# Patient Record
Sex: Male | Born: 1985 | Hispanic: No | Marital: Married | Smoking: Former smoker
Health system: Southern US, Community
[De-identification: ages and names within clinical notes are randomized; demographics above are authoritative.]

## PROBLEM LIST (undated history)

## (undated) DIAGNOSIS — A150 Tuberculosis of lung: Secondary | ICD-10-CM

## (undated) DIAGNOSIS — T7840XA Allergy, unspecified, initial encounter: Secondary | ICD-10-CM

## (undated) DIAGNOSIS — E559 Vitamin D deficiency, unspecified: Secondary | ICD-10-CM

## (undated) DIAGNOSIS — K219 Gastro-esophageal reflux disease without esophagitis: Secondary | ICD-10-CM

## (undated) DIAGNOSIS — G009 Bacterial meningitis, unspecified: Secondary | ICD-10-CM

## (undated) HISTORY — PX: OTHER SURGICAL HISTORY: SHX169

## (undated) HISTORY — DX: Tuberculosis of lung: A15.0

## (undated) HISTORY — DX: Vitamin D deficiency, unspecified: E55.9

## (undated) HISTORY — DX: Bacterial meningitis, unspecified: G00.9

## (undated) HISTORY — DX: Gastro-esophageal reflux disease without esophagitis: K21.9

## (undated) HISTORY — DX: Allergy, unspecified, initial encounter: T78.40XA

---
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
## 2008-09-02 ENCOUNTER — Emergency Department: Payer: Self-pay | Admitting: Unknown Physician Specialty

## 2014-04-14 ENCOUNTER — Ambulatory Visit: Payer: Self-pay | Admitting: Physician Assistant

## 2014-04-14 LAB — RAPID STREP-A WITH REFLX: Micro Text Report: NEGATIVE

## 2014-04-17 LAB — BETA STREP CULTURE(ARMC)

## 2016-11-14 DIAGNOSIS — A150 Tuberculosis of lung: Secondary | ICD-10-CM

## 2016-11-14 HISTORY — DX: Tuberculosis of lung: A15.0

## 2017-02-01 DIAGNOSIS — H52223 Regular astigmatism, bilateral: Secondary | ICD-10-CM | POA: Diagnosis not present

## 2017-02-01 DIAGNOSIS — H5213 Myopia, bilateral: Secondary | ICD-10-CM | POA: Diagnosis not present

## 2018-03-28 ENCOUNTER — Ambulatory Visit
Admission: EM | Admit: 2018-03-28 | Discharge: 2018-03-28 | Disposition: A | Discharge status: Home / Self Care | Payer: BC Managed Care – PPO | Attending: Family Medicine | Admitting: Family Medicine

## 2018-03-28 DIAGNOSIS — K122 Cellulitis and abscess of mouth: Secondary | ICD-10-CM | POA: Diagnosis not present

## 2018-03-28 DIAGNOSIS — J029 Acute pharyngitis, unspecified: Secondary | ICD-10-CM

## 2018-03-28 LAB — RAPID STREP SCREEN (MED CTR MEBANE ONLY): Streptococcus, Group A Screen (Direct): NEGATIVE

## 2018-03-28 MED ORDER — PREDNISONE 20 MG PO TABS: 40.0000 mg | ORAL_TABLET | Freq: Every day | ORAL | 0 refills | Status: AC

## 2018-03-28 NOTE — ED Provider Notes (Signed)
[facility] ____________________________________________  Time seen: Approximately 7:53 PM  I have reviewed the triage vital signs and the nursing notes.   HISTORY  Chief Complaint Sore Throat  HPI Casey Stevens is a 32 y.o. male presenting for evaluation of right sided throat irritation for the last several weeks.  States overall present for 3 weeks, but states present is not constant.  States no sore throat or pain.  States only an irritation to the right side of his throat.  States swallowing may slightly help.  Denies injury or trauma.  Denies difficulty swallowing, tongue swelling, facial swelling, shortness of breath, chest pain.  Reports he has had a history of similar several years ago with his uvula being swollen but that it went away shortly after being seen without medication intervention.  States the irritation had gone away after a few days, but returned today. Denies patterns, and denies food patterns. Patient states right irritation was irritated more last week after drinking alcohol but then went away after not drinking alcohol.  States does not drink alcohol every day and this is not a trigger.  States all initially started around the same time that he was having seasonal allergies with accompanying runny nose, nasal congestion.  States does snore a lot and states that his wife his previous questioned sleep apnea, but denies any apneic periods.  Denies any fevers, current cough, congestion, abdominal pain, sick contacts.  Reports otherwise feels well. Denies acid reflux symptoms.   Denies chest pain, shortness of breath, abdominal pain, dysuria, extremity pain, extremity swelling or rash. Denies recent sickness. Denies recent antibiotic use.    No past medical history on file. Denies There are no active problems to display for this patient.   The histories are not reviewed yet. Please review them in the "History" navigator section and refresh this  SmartLink.   No current facility-administered medications for this encounter.   Current Outpatient Medications:  .  predniSONE (DELTASONE) 20 MG tablet, Take 2 tablets (40 mg total) by mouth daily for 3 days., Disp: 6 tablet, Rfl: 0  Allergies Patient has no known allergies.  No family history on file.  Social History Social History   Tobacco Use  . Smoking status: Not on file  Substance Use Topics  . Alcohol use: Not on file  . Drug use: Not on file    Review of Systems Constitutional: No fever/chills ENT: No sore throat.  As above. Cardiovascular: Denies chest pain. Respiratory: Denies shortness of breath. Gastrointestinal: No abdominal pain.  No nausea, no vomiting.  No diarrhea.   Musculoskeletal: Negative for back pain. Skin: Negative for rash. ____________________________________________   PHYSICAL EXAM:  VITAL SIGNS: ED Triage Vitals  Enc Vitals Group     BP 03/28/18 1905 117/87     Pulse Rate 03/28/18 1905 77     Resp 03/28/18 1905 16     Temp 03/28/18 1905 99 F (37.2 C)     Temp Source 03/28/18 1905 Oral     SpO2 03/28/18 1905 98 %     Weight 03/28/18 1902 185 lb (83.9 kg)     Height 03/28/18 1902  (1.727 m)     Head Circumference --      Peak Flow --      Pain Score 03/28/18 1902 0     Pain Loc --      Pain Edu? --      Excl. in GC? --    Constitutional: Alert and oriented.  Well appearing and in no acute distress. Eyes: Conjunctivae are normal. Head: Atraumatic. No sinus tenderness to palpation. No swelling. No erythema.  Ears: no erythema, normal TMs bilaterally.   Nose:No nasal congestion   Mouth/Throat: Mucous membranes are moist. No pharyngeal erythema. No tonsillar swelling or exudate.  Mild uvular swelling, minimal erythema, no uvular deviation.  No oral lesions noted. Neck: No stridor.  No cervical spine tenderness to palpation. Hematological/Lymphatic/Immunilogical: No cervical lymphadenopathy. Cardiovascular: Normal rate, regular  rhythm. Grossly normal heart sounds.  Good peripheral circulation. Respiratory: Normal respiratory effort.  No retractions. No wheezes, rales or rhonchi. Good air movement.  Gastrointestinal: Soft and nontender. No CVA tenderness. Musculoskeletal: Ambulatory with steady gait. No cervical, thoracic or lumbar tenderness to palpation. Neurologic:  Normal speech and language. No gait instability. Skin:  Skin appears warm, dry and intact. No rash noted. Psychiatric: Mood and affect are normal. Speech and behavior are normal. ___________________________________________   LABS (all labs ordered are listed, but only abnormal results are displayed)  Labs Reviewed  RAPID STREP SCREEN ([facility] ONLY)  CULTURE, GROUP A STREP [facility])   ____________________________________________   PROCEDURES Procedures    INITIAL IMPRESSION / ASSESSMENT AND PLAN / ED COURSE  Pertinent labs & imaging results that were available during my care of the patient were reviewed by me and considered in my medical decision making (see chart for details).  Well-appearing patient.  No acute distress.  Presenting for evaluation of right-sided throat irritation that has been intermittently present for the last 3 weeks.  No accompanying fevers or other complaints at this time.  Reports initially onset was with seasonal allergy symptoms.  Patient does have noted mild uvulitis at this time.  Strep negative, will culture.  Suspect may be related to allergic symptoms as well as also discussed monitoring for sleep apnea.  Recommend for restart daily antihistamine and will treat with 3 days of prednisone.  Discussed strict follow-up and return parameters.Discussed indication, risks and benefits of medications with patient.  Discussed follow up with Primary care physician this week. Discussed follow up and return parameters including no resolution or any worsening concerns. Patient verbalized understanding and agreed to plan.    ____________________________________________   FINAL CLINICAL IMPRESSION(S) / ED DIAGNOSES  Final diagnoses:  Pharyngitis, unspecified etiology  Uvulitis     ED Discharge Orders        Ordered    predniSONE (DELTASONE) 20 MG tablet  Daily     03/28/18 2008       Note: This dictation was prepared with Dragon dictation along with smaller phrase technology. Any transcriptional errors that result from this process are unintentional.         Renford Dills, NP 03/28/18 2038

## 2018-03-28 NOTE — Discharge Instructions (Addendum)
Take medication as prescribed. Rest. Drink plenty of fluids.  Take over-the-counter antihistamine as discussed.  Follow up with your primary care physician or the above, this week as needed. Return to Urgent care as needed.  For any shortness of breath, difficulty swallowing or worsening concerns proceed directly to emergency room.

## 2018-03-28 NOTE — ED Triage Notes (Signed)
C/O "irritation" to right side of throat for over week. No other symptoms reported

## 2018-03-31 LAB — CULTURE, GROUP A STREP (THRC)

## 2018-04-05 ENCOUNTER — Ambulatory Visit (INDEPENDENT_AMBULATORY_CARE_PROVIDER_SITE_OTHER): Payer: BC Managed Care – PPO | Admitting: Family Medicine

## 2018-04-05 ENCOUNTER — Encounter: Payer: Self-pay | Admitting: Family Medicine

## 2018-04-05 VITALS — BP 114/64 | HR 91 | Temp 98.3°F | Resp 14 | Ht 67.0 in | Wt 202.7 lb

## 2018-04-05 DIAGNOSIS — E66811 Obesity, class 1: Secondary | ICD-10-CM

## 2018-04-05 DIAGNOSIS — R5383 Other fatigue: Secondary | ICD-10-CM | POA: Diagnosis not present

## 2018-04-05 DIAGNOSIS — Z0000 Encounter for general adult medical examination without abnormal findings: Secondary | ICD-10-CM | POA: Diagnosis not present

## 2018-04-05 DIAGNOSIS — Z789 Other specified health status: Secondary | ICD-10-CM | POA: Diagnosis not present

## 2018-04-05 DIAGNOSIS — J301 Allergic rhinitis due to pollen: Secondary | ICD-10-CM | POA: Diagnosis not present

## 2018-04-05 DIAGNOSIS — E669 Obesity, unspecified: Secondary | ICD-10-CM

## 2018-04-05 DIAGNOSIS — R7611 Nonspecific reaction to tuberculin skin test without active tuberculosis: Secondary | ICD-10-CM | POA: Diagnosis not present

## 2018-04-05 DIAGNOSIS — D509 Iron deficiency anemia, unspecified: Secondary | ICD-10-CM

## 2018-04-05 NOTE — Assessment & Plan Note (Signed)
Continue the daily allergy medicine

## 2018-04-05 NOTE — Assessment & Plan Note (Signed)
Check TSH; encourage modest weight loss

## 2018-04-05 NOTE — Progress Notes (Signed)
BP 114/64   Pulse 91   Temp 98.3 F (36.8 C) (Oral)   Resp 14   Ht  (1.702 m)   Wt 202 lb 11.2 oz (91.9 kg)   SpO2 95%   BMI 31.75 kg/m    Subjective:    Patient ID: Casey Stevens, male    DOB: 19-Nov-1985, 32 y.o.   MRN: [id number]  HPI: Casey Stevens is a 32 y.o. male  Chief Complaint  Patient presents with  . New Patient (Initial Visit)  . Fatigue    no energy    HPI Patient is here to establish care with me here at this practice; former patient of mine at previous practice; last visit was June 06, 2014 C/o fatigue Waking up in the middle of the night with son Busy Iron might be low; hx of low iron, had to take prescription for a while Not sure if runs in the family No blood in urine or stool He had changed to vegetarian diet, but not eating as healthy as it sounds; not as well balanced as it should be Works 10 hour days Just started vitamins about 3 weeks ago; bright yellow urine, [employer] As the weather has changed, he noticed a really bad allergy season; he has developed allergies; noticed two episodes of shortness of breath, not sure if reactive airway; none since; felt like he had to take some deep breaths; no ankle swelling; no wheezing; there is asthma in the family, mother has albuterol when needed, some type of bronchial spasm that she developed, has SABA if needed; these happened when he came out of the building and right before he got in the car when all the pollen was out Weight gain noted, about 10 pounds over the last year; the weight from earlier this month NOT measured 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 Allergy   . Bacterial  meningitis 1999  . GERD (gastroesophageal reflux disease)   . TB (pulmonary tuberculosis)    Past Surgical History:  Procedure Laterality Date  . bacterial meningitis     Family History  Problem Relation Age of Onset  . Hypertension Mother   . Heart attack Maternal Grandmother   . Asthma Maternal Grandmother   . Heart failure Paternal Grandmother    Social History   Tobacco Use  . Smoking status: Former Games developer  . Smokeless tobacco: Never Used  Substance Use Topics  . Alcohol use: Yes    Comment: ocassional  . Drug use: Never    Interim medical history since last visit reviewed. Allergies and medications reviewed  Review of Systems Per HPI unless specifically indicated above     Objective:    BP 114/64   Pulse 91   Temp 98.3 F (36.8 C) (Oral)   Resp 14   Ht  (1.702 m)   Wt 202 lb 11.2 oz (91.9 kg)   SpO2 95%   BMI 31.75 kg/m   Wt Readings from Last 3 Encounters:  04/05/18 202 lb 11.2 oz (91.9 kg)  03/28/18 185 lb (83.9 kg)   MD note: the weight from 03/28/18 was NOT measured, just an estimation  Physical Exam  Constitutional: He appears well-developed and well-nourished. No distress.  obese  HENT:  Head: Normocephalic and atraumatic.  Eyes: EOM are normal. No scleral icterus.  Neck: No thyromegaly present.  Cardiovascular: Normal rate and regular rhythm.  Pulmonary/Chest: Effort normal and breath sounds normal.  Abdominal: Soft. Bowel sounds are normal. He exhibits no distension.  Musculoskeletal: He exhibits no edema.  Neurological: He is alert. He has normal strength. Coordination normal.  Skin: Skin is warm and dry. No pallor.  No nailbed pallor  Psychiatric: He has a normal mood and affect. His behavior is normal. Judgment and thought content normal.    Results for orders placed or performed during the hospital encounter of 03/28/18  Rapid Strep Screen ([facility] ONLY)  Result Value Ref Range   Streptococcus, Group A Screen (Direct)  NEGATIVE NEGATIVE  Culture, group A strep  Result Value Ref Range   Specimen Description      THROAT Performed at [facility], [address], [place], [place] [address]    Special Requests      NONE Reflexed from [id number] Performed at [facility], [address], [place], [place] [address]    Culture      NO GROUP A STREP (S.PYOGENES) ISOLATED Performed at [facility], [address] [place], [place] [address]    Report Status 03/31/2018 FINAL       Assessment & Plan:   Problem List Items Addressed This Visit      Respiratory   Seasonal allergic rhinitis due to pollen (Chronic)    Continue the daily allergy medicine        Other   Positive TB test    Advised patient that he needs to follow-up with the health department; that is not something that I manage here; he verbalized understanding      Obesity (BMI 30.0-34.9) (Chronic)    Check TSH; encourage modest weight loss       Other Visit Diagnoses    Other fatigue    -  Primary   Relevant Orders   VITAMIN D 25 Hydroxy (Vit-D Deficiency, Fractures)   Iron deficiency anemia, unspecified iron deficiency anemia type       Relevant Orders   Fe+TIBC+Fer   Vegetarian diet       Relevant Medications   Omega-3 Fatty Acids (FISH OIL) 1000 MG CAPS   Other Relevant Orders   Vitamin B12   Encounter for preventive care       Relevant Orders   CBC with Differential/Platelet   COMPLETE METABOLIC PANEL WITH GFR   Lipid panel   TSH       Follow up plan: No follow-ups on file.  An after-visit summary was printed and given to the patient at check-out.  Please see the patient instructions which may contain other information and recommendations beyond what is mentioned above in the assessment and plan.  No orders of the defined types were placed in this encounter.   Orders Placed This Encounter  Procedures  . Vitamin B12  . VITAMIN D 25 Hydroxy (Vit-D Deficiency, Fractures)  . CBC  with Differential/Platelet  . COMPLETE METABOLIC PANEL WITH GFR  . Lipid panel  . TSH  . Fe+TIBC+Fer

## 2018-04-05 NOTE — Patient Instructions (Signed)
We'll check labs today If you have not heard anything from my staff in a week about any orders/referrals/studies from today, please contact us here to follow-up [phone] Consider quinoa for a complete protein  Check out the information at familydoctor.org entitled "Nutrition for Weight Loss: What You Need to Know about Fad Diets" Try to lose between 1-2 pounds per week by taking in fewer calories and burning off more calories You can succeed by limiting portions, limiting foods dense in calories and fat, becoming more active, and drinking 8 glasses of water a day (64 ounces) Don't skip meals, especially breakfast, as skipping meals may alter your metabolism Do not use over-the-counter weight loss pills or gimmicks that claim rapid weight loss A healthy BMI (or body mass index) is between 18.5 and 24.9 You can calculate your ideal BMI at the [employer] website [employer]

## 2018-04-05 NOTE — Assessment & Plan Note (Signed)
Advised patient that he needs to follow-up with the health department; that is not something that I manage here; he verbalized understanding

## 2018-04-06 ENCOUNTER — Encounter: Payer: Self-pay | Admitting: Family Medicine

## 2018-04-06 ENCOUNTER — Other Ambulatory Visit: Payer: Self-pay | Admitting: Family Medicine

## 2018-04-06 DIAGNOSIS — R0602 Shortness of breath: Secondary | ICD-10-CM

## 2018-04-06 LAB — IRON,TIBC AND FERRITIN PANEL
%SAT: 28 % (calc) (ref 15–60)
FERRITIN: 83 ng/mL (ref 20–345)
Iron: 95 ug/dL (ref 50–180)
TIBC: 336 mcg/dL (calc) (ref 250–425)

## 2018-04-06 LAB — CBC WITH DIFFERENTIAL/PLATELET
BASOS PCT: 1.3 %
Basophils Absolute: 72 cells/uL (ref 0–200)
EOS ABS: 132 {cells}/uL (ref 15–500)
EOS PCT: 2.4 %
HCT: 44.7 % (ref 38.5–50.0)
Hemoglobin: 14.9 g/dL (ref 13.2–17.1)
Lymphs Abs: 1711 cells/uL (ref 850–3900)
MCH: 27.2 pg (ref 27.0–33.0)
MCHC: 33.3 g/dL (ref 32.0–36.0)
MCV: 81.6 fL (ref 80.0–100.0)
MONOS PCT: 9.6 %
MPV: 9.4 fL (ref 7.5–12.5)
Neutro Abs: 3058 cells/uL (ref 1500–7800)
Neutrophils Relative %: 55.6 %
PLATELETS: 355 10*3/uL (ref 140–400)
RBC: 5.48 10*6/uL (ref 4.20–5.80)
RDW: 12.8 % (ref 11.0–15.0)
TOTAL LYMPHOCYTE: 31.1 %
WBC mixed population: 528 cells/uL (ref 200–950)
WBC: 5.5 10*3/uL (ref 3.8–10.8)

## 2018-04-06 LAB — LIPID PANEL
CHOLESTEROL: 173 mg/dL (ref <200)
HDL: 59 mg/dL (ref >40)
LDL CHOLESTEROL (CALC): 95 mg/dL
Non-HDL Cholesterol (Calc): 114 mg/dL (calc) (ref <130)
Total CHOL/HDL Ratio: 2.9 (calc) (ref <5.0)
Triglycerides: 93 mg/dL (ref <150)

## 2018-04-06 LAB — TSH: TSH: 0.98 mIU/L (ref 0.40–4.50)

## 2018-04-06 LAB — COMPLETE METABOLIC PANEL WITH GFR
AG Ratio: 1.4 (calc) (ref 1.0–2.5)
ALT: 24 U/L (ref 9–46)
AST: 20 U/L (ref 10–40)
Albumin: 4.5 g/dL (ref 3.6–5.1)
Alkaline phosphatase (APISO): 53 U/L (ref 40–115)
BILIRUBIN TOTAL: 0.6 mg/dL (ref 0.2–1.2)
BUN/Creatinine Ratio: 7 (calc) (ref 6–22)
BUN: 6 mg/dL — AB (ref 7–25)
CHLORIDE: 100 mmol/L (ref 98–110)
CO2: 28 mmol/L (ref 20–32)
Calcium: 9.7 mg/dL (ref 8.6–10.3)
Creat: 0.92 mg/dL (ref 0.60–1.35)
GFR, Est African American: 128 mL/min/{1.73_m2} (ref >=60)
GFR, Est Non African American: 110 mL/min/{1.73_m2} (ref >=60)
GLUCOSE: 91 mg/dL (ref 65–139)
Globulin: 3.3 g/dL (calc) (ref 1.9–3.7)
Potassium: 4.1 mmol/L (ref 3.5–5.3)
Sodium: 138 mmol/L (ref 135–146)
TOTAL PROTEIN: 7.8 g/dL (ref 6.1–8.1)

## 2018-04-06 LAB — VITAMIN D 25 HYDROXY (VIT D DEFICIENCY, FRACTURES): Vit D, 25-Hydroxy: 20 ng/mL — ABNORMAL LOW (ref 30–100)

## 2018-04-06 LAB — VITAMIN B12: Vitamin B-12: 462 pg/mL (ref 200–1100)

## 2018-04-06 MED ORDER — VITAMIN D (ERGOCALCIFEROL) 1.25 MG (50000 UNIT) PO CAPS: 50000.0000 [IU] | ORAL_CAPSULE | ORAL | 0 refills | Status: AC

## 2018-04-06 NOTE — Progress Notes (Signed)
Vit D Rx weekly for 4 weeks, then 800 iu vit D3 daily

## 2018-04-11 ENCOUNTER — Ambulatory Visit
Admission: RE | Admit: 2018-04-11 | Discharge: 2018-04-11 | Disposition: A | Discharge status: Home / Self Care | Payer: BC Managed Care – PPO | Source: Ambulatory Visit | Attending: Family Medicine | Admitting: Family Medicine

## 2018-04-11 ENCOUNTER — Other Ambulatory Visit
Admission: RE | Admit: 2018-04-11 | Discharge: 2018-04-11 | Disposition: A | Discharge status: Home / Self Care | Payer: BC Managed Care – PPO | Source: Ambulatory Visit | Attending: Family Medicine | Admitting: Family Medicine

## 2018-04-11 DIAGNOSIS — R0602 Shortness of breath: Secondary | ICD-10-CM

## 2018-04-11 LAB — FIBRIN DERIVATIVES D-DIMER (ARMC ONLY): FIBRIN DERIVATIVES D-DIMER (ARMC): 189.71 ng{FEU}/mL (ref 0.00–499.00)

## 2018-05-15 ENCOUNTER — Encounter: Payer: Self-pay | Admitting: Family Medicine

## 2018-05-15 DIAGNOSIS — E559 Vitamin D deficiency, unspecified: Secondary | ICD-10-CM | POA: Insufficient documentation

## 2018-05-15 HISTORY — DX: Vitamin D deficiency, unspecified: E55.9

## 2018-06-04 ENCOUNTER — Encounter: Payer: Self-pay | Admitting: Family Medicine

## 2018-06-04 MED ORDER — PANTOPRAZOLE SODIUM 20 MG PO TBEC: 20.0000 mg | DELAYED_RELEASE_TABLET | Freq: Every day | ORAL | 1 refills | Status: DC

## 2019-10-29 ENCOUNTER — Encounter: Payer: Self-pay | Admitting: Family Medicine

## 2019-10-29 ENCOUNTER — Encounter: Payer: BC Managed Care – PPO | Admitting: Family Medicine

## 2019-11-26 ENCOUNTER — Other Ambulatory Visit: Payer: Self-pay

## 2019-11-26 ENCOUNTER — Ambulatory Visit (INDEPENDENT_AMBULATORY_CARE_PROVIDER_SITE_OTHER): Payer: BC Managed Care – PPO | Admitting: Family Medicine

## 2019-11-26 ENCOUNTER — Encounter: Payer: Self-pay | Admitting: Family Medicine

## 2019-11-26 VITALS — BP 116/80 | HR 92 | Temp 97.3°F | Resp 14 | Ht 68.0 in | Wt 205.8 lb

## 2019-11-26 DIAGNOSIS — Z130 Encounter for screening for diseases of the blood and blood-forming organs and certain disorders involving the immune mechanism: Secondary | ICD-10-CM

## 2019-11-26 DIAGNOSIS — Z13220 Encounter for screening for lipoid disorders: Secondary | ICD-10-CM | POA: Diagnosis not present

## 2019-11-26 DIAGNOSIS — Z0000 Encounter for general adult medical examination without abnormal findings: Secondary | ICD-10-CM | POA: Diagnosis not present

## 2019-11-26 DIAGNOSIS — E559 Vitamin D deficiency, unspecified: Secondary | ICD-10-CM

## 2019-11-26 DIAGNOSIS — Z13228 Encounter for screening for other metabolic disorders: Secondary | ICD-10-CM

## 2019-11-26 DIAGNOSIS — Z1329 Encounter for screening for other suspected endocrine disorder: Secondary | ICD-10-CM | POA: Diagnosis not present

## 2019-11-26 NOTE — Progress Notes (Signed)
Patient: Casey Stevens, Male    DOB: 12-Jun-1986, 34 y.o.   MRN: [id number] Danelle Berry, PA-C Visit Date: 11/26/2019  Today's Provider: Danelle Berry, PA-C   Chief Complaint  Patient presents with  . Annual Exam    would like a CBC done   Subjective:   Annual physical exam:  Casey Stevens is a 34 y.o. male who presents today for health maintenance and annual & complete physical exam.  He feels well.  He reports exercising a few times a week, started a few weeks ago Diet no meat in diet, eats fish occasionally, no fried food He reports he is sleeping well.  Has Vit D deficiency - will recheck labs today   USPSTF grade A and B recommendations - reviewed and addressed today  Depression:  Phq 9 completed today by patient, was reviewed by me with patient in the room, score is  negative, pt feels good PHQ 2/9 Scores 11/26/2019 04/05/2018  PHQ - 2 Score 0 0  PHQ- 9 Score 0 -   Depression screen [facility] 2/9 11/26/2019 04/05/2018  Decreased Interest 0 0  Down, Depressed, Hopeless 0 0  PHQ - 2 Score 0 0  Altered sleeping 0 -  Tired, decreased energy 0 -  Change in appetite 0 -  Feeling bad or failure about yourself  0 -  Trouble concentrating 0 -  Moving slowly or fidgety/restless 0 -  Suicidal thoughts 0 -  PHQ-9 Score 0 -  Difficult doing work/chores Not difficult at all -    Hep C Screening: n/a STD testing and prevention (HIV/chl/gon/syphilis): none needed Prostate cancer: n/a No results found for: PSA Intimate partner violence:  Feels safe  Advanced Care Planning:  A voluntary discussion about advance care planning including the explanation and discussion of advance directives.  Discussed health care proxy and Living will, and the patient was able to identify a health care proxy as his wife.  Patient does not have a living will at present time. If patient does have living will, I have requested they bring this to the clinic to be scanned in to their chart.   Skin  cancer:   Pt reports no hx of skin cancer, suspicious lesions/biopsies in the past.  Colorectal cancer:  colonoscopy is n/a  Lung cancer:   Low Dose CT Chest recommended if Age 19-80 years, 30 pack-year currently smoking OR have quit w/in 15years. Patient does not qualify.   Social History   Tobacco Use  . Smoking status: Former Games developer  . Smokeless tobacco: Never Used  Substance Use Topics  . Alcohol use: Yes    Comment: ocassional     Alcohol screening:   Office Visit from 11/26/2019 in [facility]  AUDIT-C Score  2      AAA:  The USPSTF recommends one-time screening with ultrasonography in men ages 5 to 68 years who have ever smoked  ECG: none today  Blood pressure/Hypertension: BP Readings from Last 3 Encounters:  11/26/19 116/80  04/05/18 114/64  03/28/18 117/87   Weight/Obesity: Wt Readings from Last 3 Encounters:  11/26/19 205 lb 12.8 oz (93.4 kg)  04/05/18 202 lb 11.2 oz (91.9 kg)  03/28/18 185 lb (83.9 kg)   BMI Readings from Last 3 Encounters:  11/26/19 31.29 kg/m  04/05/18 31.75 kg/m  03/28/18 28.13 kg/m    Lipids:  Lab Results  Component Value Date   CHOL 173 04/05/2018   Lab Results  Component Value Date  HDL 59 04/05/2018   Lab Results  Component Value Date   LDLCALC 95 04/05/2018   Lab Results  Component Value Date   TRIG 93 04/05/2018   Lab Results  Component Value Date   CHOLHDL 2.9 04/05/2018   No results found for: LDLDIRECT Based on the results of lipid panel his/her cardiovascular risk factor ( using [facility] )  in the next 10 years is : The ASCVD Risk score Denman George(Goff DC Jr., et al., 2013) failed to calculate for the following reasons:   The 2013 ASCVD risk score is only valid for ages 2540 to 6979 Glucose:  Glucose, Bld  Date Value Ref Range Status  04/05/2018 91 65 - 139 mg/dL Final    Comment:    .        Non-fasting reference interval .     Social History      He  reports that he has quit  smoking. He has never used smokeless tobacco. He reports current alcohol use. He reports that he does not use drugs.       Social History   Socioeconomic History  . Marital status: Married    Spouse name: Not on file  . Number of children: Not on file  . Years of education: Not on file  . Highest education level: Not on file  Occupational History  . Not on file  Tobacco Use  . Smoking status: Former Games developermoker  . Smokeless tobacco: Never Used  Substance and Sexual Activity  . Alcohol use: Yes    Comment: ocassional  . Drug use: Never  . Sexual activity: Yes  Other Topics Concern  . Not on file  Social History Narrative  . Not on file   Social Determinants of Health   Financial Resource Strain:   . Difficulty of Paying Living Expenses: Not on file  Food Insecurity:   . Worried About Programme researcher, broadcasting/film/videounning Out of Food in the Last Year: Not on file  . Ran Out of Food in the Last Year: Not on file  Transportation Needs:   . Lack of Transportation (Medical): Not on file  . Lack of Transportation (Non-Medical): Not on file  Physical Activity:   . Days of Exercise per Week: Not on file  . Minutes of Exercise per Session: Not on file  Stress:   . Feeling of Stress : Not on file  Social Connections:   . Frequency of Communication with Friends and Family: Not on file  . Frequency of Social Gatherings with Friends and Family: Not on file  . Attends Religious Services: Not on file  . Active Member of Clubs or Organizations: Not on file  . Attends BankerClub or Organization Meetings: Not on file  . Marital Status: Not on file         Social History   Socioeconomic History  . Marital status: Married    Spouse name: Not on file  . Number of children: Not on file  . Years of education: Not on file  . Highest education level: Not on file  Occupational History  . Not on file  Tobacco Use  . Smoking status: Former Games developermoker  . Smokeless tobacco: Never Used  Substance and Sexual Activity  . Alcohol  use: Yes    Comment: ocassional  . Drug use: Never  . Sexual activity: Yes  Other Topics Concern  . Not on file  Social History Narrative  . Not on file   Social Determinants of Health   Financial  Resource Strain:   . Difficulty of Paying Living Expenses: Not on file  Food Insecurity:   . Worried About Programme researcher, broadcasting/film/video in the Last Year: Not on file  . Ran Out of Food in the Last Year: Not on file  Transportation Needs:   . Lack of Transportation (Medical): Not on file  . Lack of Transportation (Non-Medical): Not on file  Physical Activity:   . Days of Exercise per Week: Not on file  . Minutes of Exercise per Session: Not on file  Stress:   . Feeling of Stress : Not on file  Social Connections:   . Frequency of Communication with Friends and Family: Not on file  . Frequency of Social Gatherings with Friends and Family: Not on file  . Attends Religious Services: Not on file  . Active Member of Clubs or Organizations: Not on file  . Attends Banker Meetings: Not on file  . Marital Status: Not on file  Intimate Partner Violence:   . Fear of Current or Ex-Partner: Not on file  . Emotionally Abused: Not on file  . Physically Abused: Not on file  . Sexually Abused: Not on file    Family History        Family Status  Relation Name Status  . Mother  Alive  . Father  Alive  . Sister 1 Alive  . Brother  Alive  . Son 1 Alive  . MGM  Deceased  . MGF  Deceased       black lung  . PGM  Alive  . PGF  Deceased       natural causes  . Brother  Alive        His family history includes Asthma in his maternal grandmother; Heart attack in his maternal grandmother; Heart failure in his paternal grandmother; Hypertension in his mother.       Family History  Problem Relation Age of Onset  . Hypertension Mother   . Heart attack Maternal Grandmother   . Asthma Maternal Grandmother   . Heart failure Paternal Grandmother     Patient Active Problem List   Diagnosis  Date Noted  . Vitamin D deficiency 05/15/2018  . Seasonal allergic rhinitis due to pollen 04/05/2018  . Obesity (BMI 30.0-34.9) 04/05/2018  . 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 bacterial meningitis       Current Outpatient Medications:  .  loratadine (CLARITIN) 10 MG tablet, Take 10 mg by mouth daily., Disp: , Rfl:  .  Multiple Vitamin (MULTIVITAMIN) tablet, Take 1 tablet by mouth daily., Disp: , Rfl:  .  Omega-3 Fatty Acids (FISH OIL) 1000 MG CAPS, Take 1 capsule (1,000 mg total) by mouth daily. (actually 1500 mg), Disp: , Rfl:  .  pantoprazole (PROTONIX) 20 MG tablet, Take 1 tablet (20 mg total) by mouth daily. (Patient not taking: Reported on 11/26/2019), Disp: 30 tablet, Rfl: 1  No Known Allergies  Patient Care Team: Danelle Berry, PA-C as PCP - General (Family Medicine)  I personally reviewed active problem list, medication list, allergies, family history, social history, health maintenance, notes from last encounter, lab results, imaging with the patient/caregiver today.   Review of Systems  Constitutional: Negative.  Negative for activity change, appetite change, fatigue and unexpected weight change.  HENT: Negative.   Eyes: Negative.   Respiratory: Negative.  Negative for shortness of breath.   Cardiovascular: Negative.  Negative for chest pain, palpitations  and leg swelling.  Gastrointestinal: Negative.  Negative for abdominal pain and blood in stool.  Endocrine: Negative.   Genitourinary: Negative.  Negative for decreased urine volume, difficulty urinating, testicular pain and urgency.  Skin: Negative.  Negative for color change and pallor.  Allergic/Immunologic: Negative.   Neurological: Negative.  Negative for syncope, weakness, light-headedness and numbness.  Psychiatric/Behavioral: Negative.  Negative for confusion, dysphoric mood, self-injury and suicidal ideas. The patient is not nervous/anxious.   All other  systems reviewed and are negative.         Objective:   Vitals:  Vitals:   11/26/19 1301  BP: 116/80  Pulse: 92  Resp: 14  Temp: (!) 97.3 F (36.3 C)  TempSrc: Temporal  SpO2: 97%  Weight: 205 lb 12.8 oz (93.4 kg)  Height: 5\' 8"  (1.727 m)    Body mass index is 31.29 kg/m.  Physical Exam Constitutional:      General: He is not in acute distress.    Appearance: Normal appearance. He is well-developed. He is not ill-appearing or toxic-appearing.  HENT:     Head: Normocephalic and atraumatic.     Jaw: No trismus.     Right Ear: Tympanic membrane, ear canal and external ear normal.     Left Ear: Tympanic membrane, ear canal and external ear normal.     Nose: No mucosal edema or rhinorrhea.     Right Sinus: No maxillary sinus tenderness or frontal sinus tenderness.     Left Sinus: No maxillary sinus tenderness or frontal sinus tenderness.     Mouth/Throat:     Pharynx: Uvula midline. No oropharyngeal exudate, posterior oropharyngeal erythema or uvula swelling.  Eyes:     General: Lids are normal.     Conjunctiva/sclera: Conjunctivae normal.     Pupils: Pupils are equal, round, and reactive to light.  Neck:     Trachea: Trachea and phonation normal. No tracheal deviation.  Cardiovascular:     Rate and Rhythm: Regular rhythm.     Pulses: Normal pulses.          Radial pulses are 2+ on the right side and 2+ on the left side.       Posterior tibial pulses are 2+ on the right side and 2+ on the left side.     Heart sounds: Normal heart sounds. No murmur. No friction rub. No gallop.   Pulmonary:     Effort: Pulmonary effort is normal.     Breath sounds: Normal breath sounds. No wheezing, rhonchi or rales.  Abdominal:     General: Bowel sounds are normal. There is no distension.     Palpations: Abdomen is soft.     Tenderness: There is no abdominal tenderness. There is no guarding or rebound.  Musculoskeletal:        General: Normal range of motion.     Cervical back:  Normal range of motion and neck supple.  Skin:    General: Skin is warm and dry.     Capillary Refill: Capillary refill takes less than 2 seconds.     Findings: No rash.  Neurological:     Mental Status: He is alert and oriented to person, place, and time.     Gait: Gait normal.  Psychiatric:        Speech: Speech normal.        Behavior: Behavior normal.      No results found for this or any previous visit (from the past 2160 hour(s)).   PHQ2/9:  Depression screen [facility] 2/9 11/26/2019 04/05/2018  Decreased Interest 0 0  Down, Depressed, Hopeless 0 0  PHQ - 2 Score 0 0  Altered sleeping 0 -  Tired, decreased energy 0 -  Change in appetite 0 -  Feeling bad or failure about yourself  0 -  Trouble concentrating 0 -  Moving slowly or fidgety/restless 0 -  Suicidal thoughts 0 -  PHQ-9 Score 0 -  Difficult doing work/chores Not difficult at all -    Fall Risk: Fall Risk  11/26/2019 04/05/2018  Falls in the past year? 0 No  Number falls in past yr: 0 -  Injury with Fall? 0 -    Functional Status Survey: Is the patient deaf or have difficulty hearing?: No Does the patient have difficulty seeing, even when wearing glasses/contacts?: No Does the patient have difficulty concentrating, remembering, or making decisions?: No Does the patient have difficulty walking or climbing stairs?: No Does the patient have difficulty dressing or bathing?: No Does the patient have difficulty doing errands alone such as visiting a doctor's office or shopping?: No   Assessment & Plan:    CPE completed today   . Prostate cancer screening and PSA options (with potential risks and benefits of testing vs not testing) were discussed along with recent recs/guidelines, shared decision making and handout/information given to pt today  . USPSTF grade A and B recommendations reviewed with patient; age-appropriate recommendations, preventive care, screening tests, etc discussed and encouraged; healthy  living encouraged; see AVS for patient education given to patient  . Discussed importance of 150 minutes of physical activity weekly, AHA exercise recommendations given to pt in AVS/handout  . Discussed importance of healthy diet:  eating lean meats and proteins, avoiding trans fats and saturated fats, avoid simple sugars and excessive carbs in diet, eat 6 servings of fruit/vegetables daily and drink plenty of water and avoid sweet beverages.  DASH diet reviewed if pt has HTN  . Recommended pt to do annual eye exam and routine dental exams/cleanings  . Reviewed Health Maintenance: Health Maintenance  Topic Date Due  . HIV Screening  10/07/2001  . TETANUS/TDAP  11/14/2024  . INFLUENZA VACCINE  Completed   . Immunizations: Immunization History  Administered Date(s) Administered  . Influenza-Unspecified 09/07/2019  . PFIZER SARS-COV-2 Vaccination 11/19/2019     ICD-10-CM   1. Adult general medical exam  Z00.00 CBC w/ Diff    CMP w GFR    Lipid Panel    Vit D  2. Vitamin D deficiency  E55.9 Vit D   low Vit D, last was about a year ato and was 20, no dairy, no vit d supplement, on multivitamin, recheck today  3. Screening for endocrine, metabolic and immunity disorder  Z13.29 CBC w/ Diff   Z13.228 CMP w GFR   Z13.0 Lipid Panel  4. Screening for lipoid disorders  Z13.220 Lipid Panel  5. Screening for deficiency anemia  Z13.0 CBC w/ Diff      Danelle Berry, PA-C 11/26/19 1:13 PM  [facility]

## 2019-11-26 NOTE — Patient Instructions (Signed)
Preventive Care 19-34 Years Old, Male Preventive care refers to lifestyle choices and visits with your health care provider that can promote health and wellness. This includes:  A yearly physical exam. This is also called an annual well check.  Regular dental and eye exams.  Immunizations.  Screening for certain conditions.  Healthy lifestyle choices, such as eating a healthy diet, getting regular exercise, not using drugs or products that contain nicotine and tobacco, and limiting alcohol use. What can I expect for my preventive care visit? Physical exam Your health care provider will check:  Height and weight. These may be used to calculate body mass index (BMI), which is a measurement that tells if you are at a healthy weight.  Heart rate and blood pressure.  Your skin for abnormal spots. Counseling Your health care provider may ask you questions about:  Alcohol, tobacco, and drug use.  Emotional well-being.  Home and relationship well-being.  Sexual activity.  Eating habits.  Work and work Statistician. What immunizations do I need?  Influenza (flu) vaccine  This is recommended every year. Tetanus, diphtheria, and pertussis (Tdap) vaccine  You may need a Td booster every 10 years. Varicella (chickenpox) vaccine  You may need this vaccine if you have not already been vaccinated. Human papillomavirus (HPV) vaccine  If recommended by your health care provider, you may need three doses over 6 months. Measles, mumps, and rubella (MMR) vaccine  You may need at least one dose of MMR. You may also need a second dose. Meningococcal conjugate (MenACWY) vaccine  One dose is recommended if you are 45-76 years old and a Market researcher living in a residence hall, or if you have one of several medical conditions. You may also need additional booster doses. Pneumococcal conjugate (PCV13) vaccine  You may need this if you have certain conditions and were not  previously vaccinated. Pneumococcal polysaccharide (PPSV23) vaccine  You may need one or two doses if you smoke cigarettes or if you have certain conditions. Hepatitis A vaccine  You may need this if you have certain conditions or if you travel or work in places where you may be exposed to hepatitis A. Hepatitis B vaccine  You may need this if you have certain conditions or if you travel or work in places where you may be exposed to hepatitis B. Haemophilus influenzae type b (Hib) vaccine  You may need this if you have certain risk factors. You may receive vaccines as individual doses or as more than one vaccine together in one shot (combination vaccines). Talk with your health care provider about the risks and benefits of combination vaccines. What tests do I need? Blood tests  Lipid and cholesterol levels. These may be checked every 5 years starting at age 17.  Hepatitis C test.  Hepatitis B test. Screening   Diabetes screening. This is done by checking your blood sugar (glucose) after you have not eaten for a while (fasting).  Sexually transmitted disease (STD) testing. Talk with your health care provider about your test results, treatment options, and if necessary, the need for more tests. Follow these instructions at home: Eating and drinking   Eat a diet that includes fresh fruits and vegetables, whole grains, lean protein, and low-fat dairy products.  Take vitamin and mineral supplements as recommended by your health care provider.  Do not drink alcohol if your health care provider tells you not to drink.  If you drink alcohol: ? Limit how much you have to 0-2  drinks a day. ? Be aware of how much alcohol is in your drink. In the [place] one drink equals one 12 oz bottle of beer (355 mL), one 5 oz glass of wine (148 mL), or one 1 oz glass of hard liquor (44 mL). Lifestyle  Take daily care of your teeth and gums.  Stay active. Exercise for at least 30 minutes on 5 or  more days each week.  Do not use any products that contain nicotine or tobacco, such as cigarettes, e-cigarettes, and chewing tobacco. If you need help quitting, ask your health care provider.  If you are sexually active, practice safe sex. Use a condom or other form of protection to prevent STIs (sexually transmitted infections). What's next?  Go to your health care provider once a year for a well check visit.  Ask your health care provider how often you should have your eyes and teeth checked.  Stay up to date on all vaccines. This information is not intended to replace advice given to you by your health care provider. Make sure you discuss any questions you have with your health care provider. Document Revised: 10/25/2018 Document Reviewed: 10/25/2018 Elsevier Patient Education  2020 [employer].

## 2019-11-27 LAB — LIPID PANEL
Cholesterol: 152 mg/dL (ref <200)
HDL: 48 mg/dL (ref >=40)
LDL Cholesterol (Calc): 79 mg/dL (calc)
Non-HDL Cholesterol (Calc): 104 mg/dL (calc) (ref <130)
Total CHOL/HDL Ratio: 3.2 (calc) (ref <5.0)
Triglycerides: 153 mg/dL — ABNORMAL HIGH (ref <150)

## 2019-11-27 LAB — COMPLETE METABOLIC PANEL WITH GFR
AG Ratio: 1.4 (calc) (ref 1.0–2.5)
ALT: 27 U/L (ref 9–46)
AST: 20 U/L (ref 10–40)
Albumin: 4.5 g/dL (ref 3.6–5.1)
Alkaline phosphatase (APISO): 53 U/L (ref 36–130)
BUN: 12 mg/dL (ref 7–25)
CO2: 29 mmol/L (ref 20–32)
Calcium: 9.5 mg/dL (ref 8.6–10.3)
Chloride: 104 mmol/L (ref 98–110)
Creat: 0.99 mg/dL (ref 0.60–1.35)
GFR, Est African American: 116 mL/min/{1.73_m2} (ref >=60)
GFR, Est Non African American: 100 mL/min/{1.73_m2} (ref >=60)
Globulin: 3.2 g/dL (calc) (ref 1.9–3.7)
Glucose, Bld: 92 mg/dL (ref 65–99)
Potassium: 4.2 mmol/L (ref 3.5–5.3)
Sodium: 140 mmol/L (ref 135–146)
Total Bilirubin: 0.4 mg/dL (ref 0.2–1.2)
Total Protein: 7.7 g/dL (ref 6.1–8.1)

## 2019-11-27 LAB — CBC WITH DIFFERENTIAL/PLATELET
Absolute Monocytes: 470 cells/uL (ref 200–950)
Basophils Absolute: 50 cells/uL (ref 0–200)
Basophils Relative: 1 %
Eosinophils Absolute: 70 cells/uL (ref 15–500)
Eosinophils Relative: 1.4 %
HCT: 43.3 % (ref 38.5–50.0)
Hemoglobin: 14.4 g/dL (ref 13.2–17.1)
Lymphs Abs: 1515 cells/uL (ref 850–3900)
MCH: 27.6 pg (ref 27.0–33.0)
MCHC: 33.3 g/dL (ref 32.0–36.0)
MCV: 83.1 fL (ref 80.0–100.0)
MPV: 9.7 fL (ref 7.5–12.5)
Monocytes Relative: 9.4 %
Neutro Abs: 2895 cells/uL (ref 1500–7800)
Neutrophils Relative %: 57.9 %
Platelets: 304 10*3/uL (ref 140–400)
RBC: 5.21 10*6/uL (ref 4.20–5.80)
RDW: 12.1 % (ref 11.0–15.0)
Total Lymphocyte: 30.3 %
WBC: 5 10*3/uL (ref 3.8–10.8)

## 2019-11-27 LAB — VITAMIN D 25 HYDROXY (VIT D DEFICIENCY, FRACTURES): Vit D, 25-Hydroxy: 10 ng/mL — ABNORMAL LOW (ref 30–100)

## 2019-11-28 ENCOUNTER — Other Ambulatory Visit: Payer: Self-pay | Admitting: Family Medicine

## 2019-11-28 MED ORDER — VITAMIN D (ERGOCALCIFEROL) 1.25 MG (50000 UNIT) PO CAPS: 50000.0000 [IU] | ORAL_CAPSULE | ORAL | 0 refills | Status: DC

## 2020-02-09 IMAGING — CR DG CHEST 2V
2 series · 2 of 2 positions shown · non-contrast
Comparison: None.

CLINICAL DATA: Shortness of breath for 3 weeks.

EXAM:
CHEST - 2 VIEW

[chest pa]
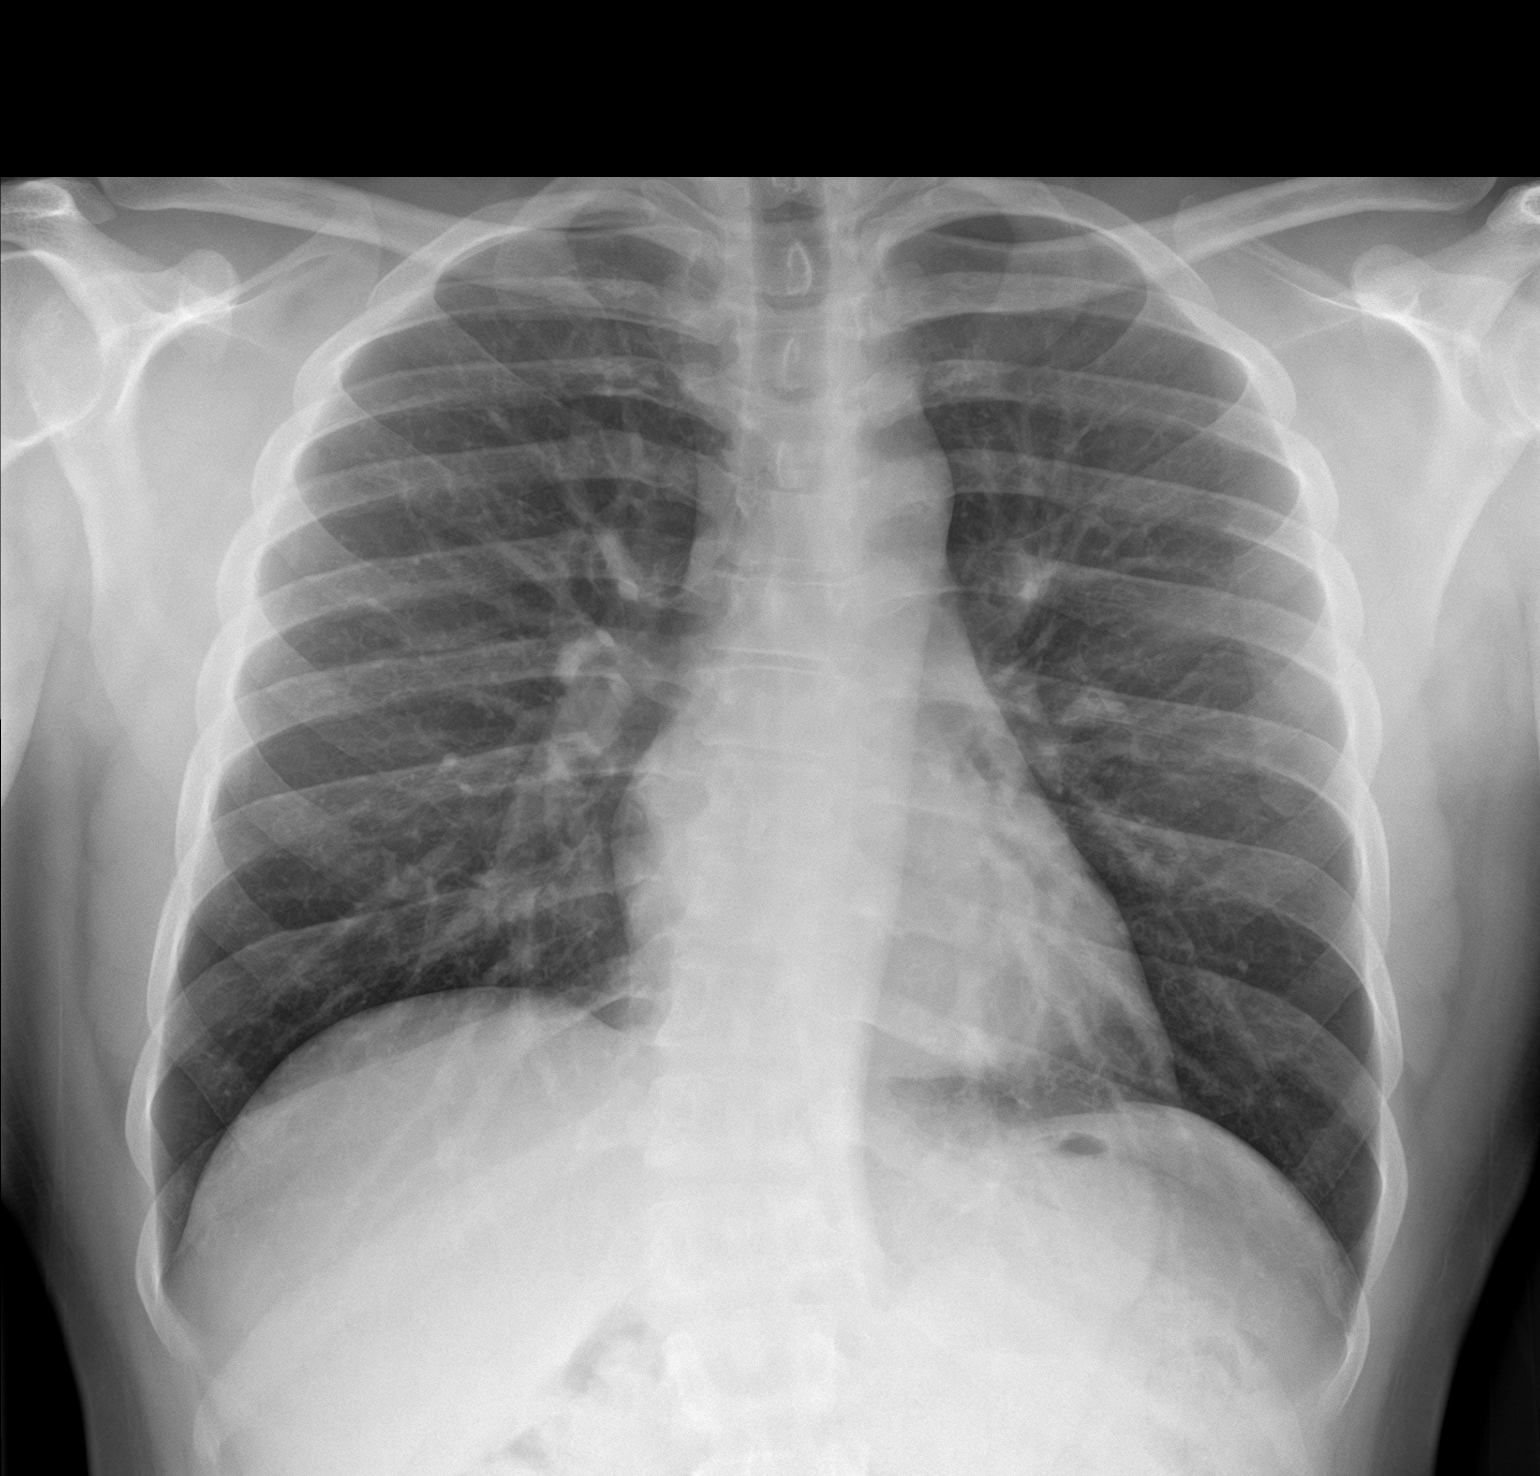

[chest lat]
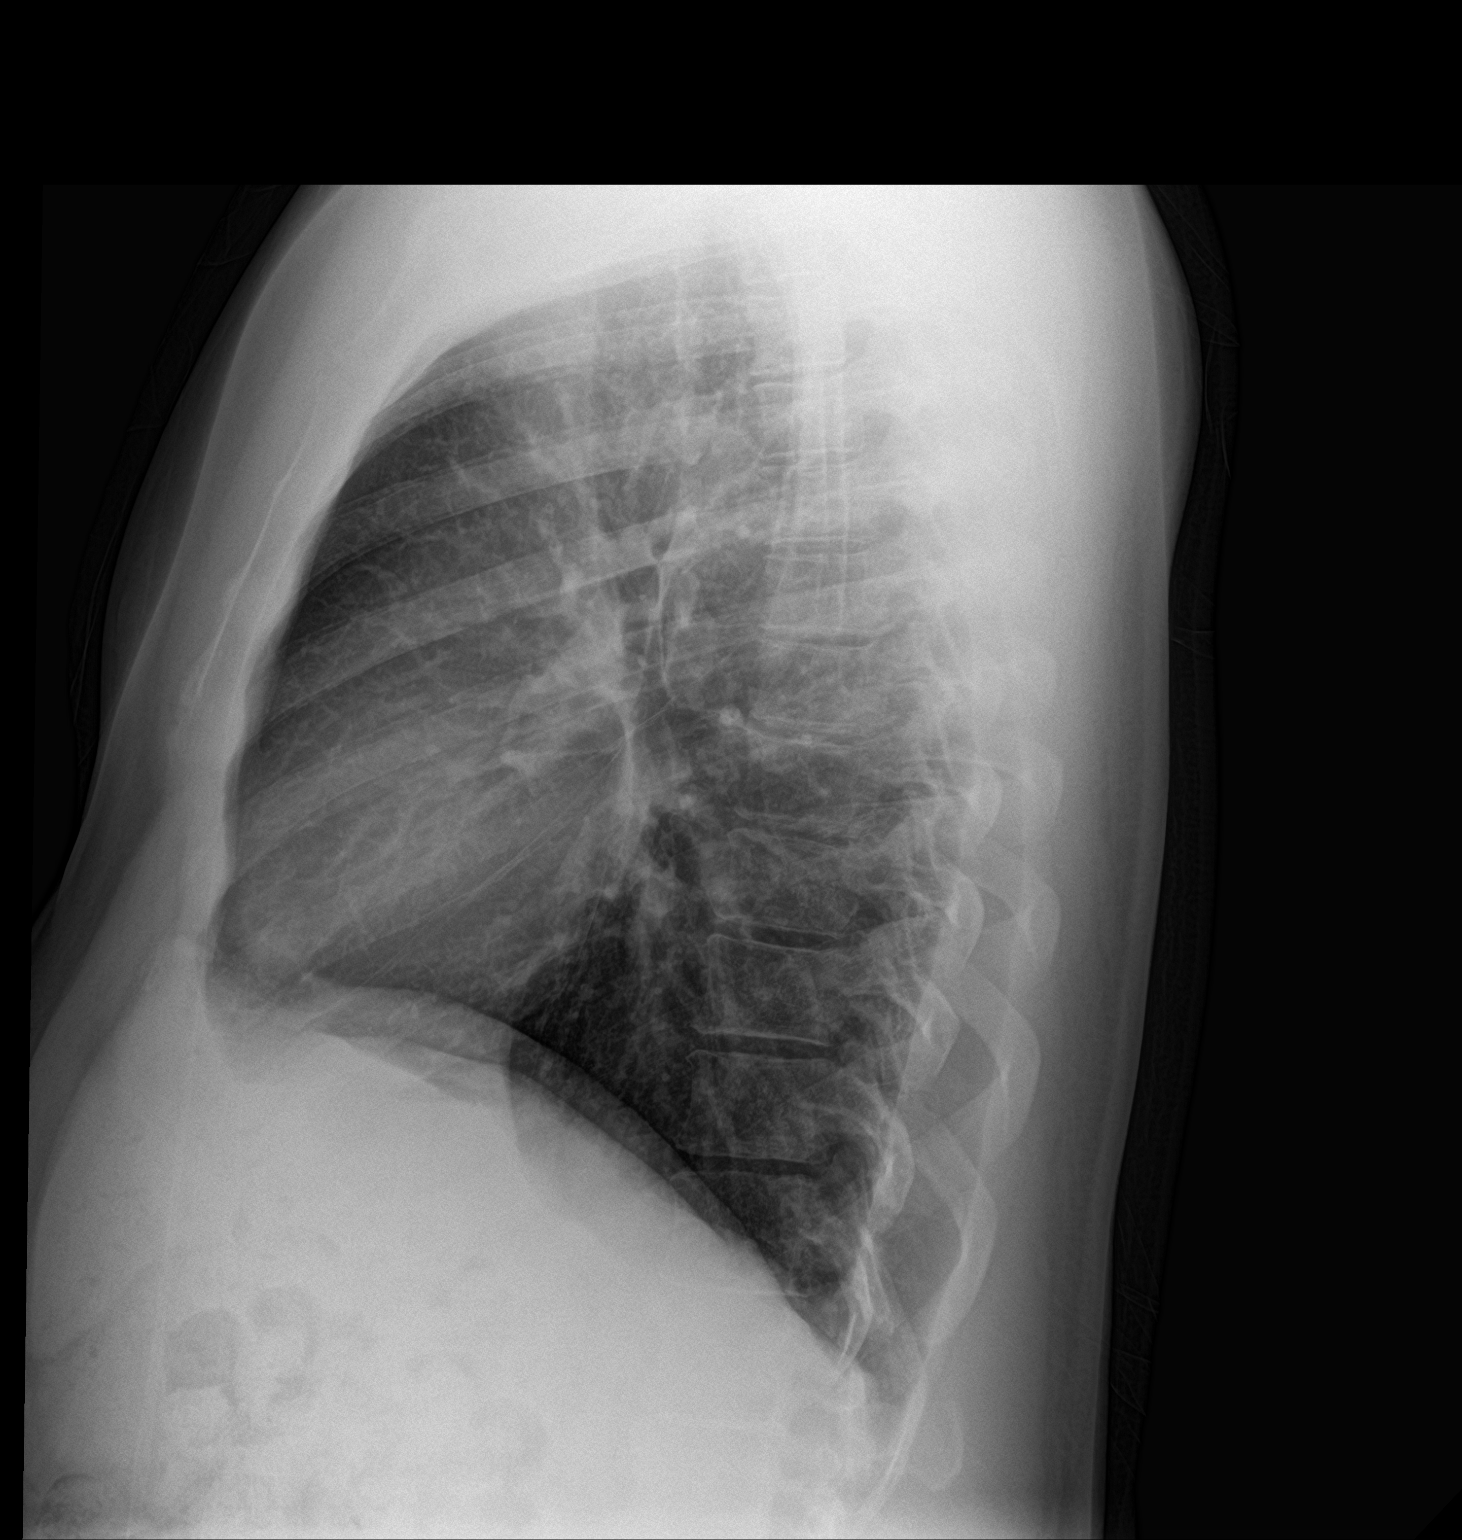

[2 of 2 positions shown; findings below may reference images not displayed]

FINDINGS: Heart and mediastinal contours are within normal limits. No focal
opacities or effusions. No acute bony abnormality.
IMPRESSION: No active cardiopulmonary disease.

## 2020-06-09 ENCOUNTER — Ambulatory Visit: Payer: BC Managed Care – PPO | Admitting: Internal Medicine

## 2020-06-09 ENCOUNTER — Other Ambulatory Visit: Payer: Self-pay

## 2020-06-09 ENCOUNTER — Encounter: Payer: Self-pay | Admitting: Internal Medicine

## 2020-06-09 VITALS — BP 110/78 | HR 93 | Temp 98.3°F | Resp 16 | Ht 68.0 in | Wt 195.4 lb

## 2020-06-09 DIAGNOSIS — S46012A Strain of muscle(s) and tendon(s) of the rotator cuff of left shoulder, initial encounter: Secondary | ICD-10-CM

## 2020-06-09 DIAGNOSIS — M25512 Pain in left shoulder: Secondary | ICD-10-CM | POA: Insufficient documentation

## 2020-06-09 NOTE — Patient Instructions (Signed)
Referral placed for orthopedics today

## 2020-06-09 NOTE — Progress Notes (Signed)
Patient ID: Casey Stevens, male    DOB: 26-Feb-1986, 34 y.o.   MRN: [id number]  PCP: Danelle Berry, PA-C  Chief Complaint  Patient presents with  . Shoulder Pain    left shoulder pain, he belives it is an injury from working out,  started in April, he says it has progressed since then    Subjective:   Casey Stevens is a 34 y.o. male, presents to clinic with CC of the following:  Chief Complaint  Patient presents with  . Shoulder Pain    left shoulder pain, he belives it is an injury from working out,  started in April, he says it has progressed since then    HPI:  Patient is a 34 year old male patient of Danelle Berry Presents today with left shoulder pain.  Patient with shoulder pain since April, was working out prior, and workouts did not include heavy weights, just more resistance bands and some pull-ups at times.  Had no one-time trauma could recall, just woke up 1 morning with shoulder pain and stiffness. Describes pain as intermittently sharp, not painful at rest presently, but certain movements cause increased pain, with the pain worse and less movement as needed to cause the pain in the more recent past.  Has been progressive. Pain located at the shoulder joint, not felt more in front or more behind, noted more on top. No h/o major shoulder injuries Not ice, not take any regular medicines to help in the more recent past. He noted after it first happened, and he tried conservative measures with rest, has not worked out since April, and symptoms have continued to progress. He denies any pains down the arm, no numbness or tingling in the upper extremity, not dropping things.  Also denies any upper back pains, infrascapular pain, and no neck pains. 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    Patient Active Problem List   Diagnosis Date Noted  . Vitamin D deficiency 05/15/2018  . Seasonal allergic rhinitis due to pollen 04/05/2018  .  Obesity (BMI 30.0-34.9) 04/05/2018  . Positive TB test 04/05/2018      Current Outpatient Medications:  .  loratadine (CLARITIN) 10 MG tablet, Take 10 mg by mouth daily., Disp: , Rfl:  .  Multiple Vitamin (MULTIVITAMIN) tablet, Take 1 tablet by mouth daily., Disp: , Rfl:  .  Omega-3 Fatty Acids (FISH OIL) 1000 MG CAPS, Take 1 capsule (1,000 mg total) by mouth daily. (actually 1500 mg), Disp: , Rfl:  .  famotidine (PEPCID) 10 MG tablet, Take by mouth., Disp: , Rfl:    No Known Allergies   Past Surgical History:  Procedure Laterality Date  . bacterial meningitis       Family History  Problem Relation Age of Onset  . Hypertension Mother   . Heart attack Maternal Grandmother   . Asthma Maternal Grandmother   . Heart failure Paternal Grandmother      Social History   Tobacco Use  . Smoking status: Former Games developer  . Smokeless tobacco: Never Used  Substance Use Topics  . Alcohol use: Yes    Comment: ocassional    With staff assistance, above reviewed with the patient today.  ROS: As per HPI, otherwise no specific complaints on a limited and focused system review   No results found for this or any previous visit (from the past 72 hour(s)).   PHQ2/9: Depression screen [facility] 2/9 06/09/2020 11/26/2019 04/05/2018  Decreased  Interest 0 0 0  Down, Depressed, Hopeless 0 0 0  PHQ - 2 Score 0 0 0  Altered sleeping 0 0 -  Tired, decreased energy 0 0 -  Change in appetite 0 0 -  Feeling bad or failure about yourself  0 0 -  Trouble concentrating 0 0 -  Moving slowly or fidgety/restless 0 0 -  Suicidal thoughts 0 0 -  PHQ-9 Score 0 0 -  Difficult doing work/chores Not difficult at all Not difficult at all -   PHQ-2/9 Result is neg  Fall Risk: Fall Risk  06/09/2020 11/26/2019 04/05/2018  Falls in the past year? 0 0 No  Number falls in past yr: 0 0 -  Injury with Fall? 0 0 -      Objective:   Vitals:   06/09/20 1027  BP: 110/78  Pulse: 93  Resp: 16  Temp: 98.3 F (36.8  C)  TempSrc: Temporal  SpO2: 99%  Weight: 195 lb 6.4 oz (88.6 kg)  Height: 5\' 8"  (1.727 m)    Body mass index is 29.71 kg/m.  Physical Exam   NAD, well developed, masked, very pleasant, HEENT - [place]/AT, sclera anicteric, Skin-positive tattoos noted Ext - Left shoulder - ROM testing -had good range of motion with abduction and forward elevation, with some discomfort noted lowering from full forward elevation and full abduction,  Apley's negative  NT with palpation at A-C joint  NT anterior joint line, NT posterior joint line  NT subacromial bursa region  Apprehension sign neg  Impingement test neg  RC testing - supraspinatus  with adequate strength with significant pain upon testing and slightly weaker on the left than right side    Lift-off test with good strength, no marked pain    Internal rotation with good strength and mild pain testing versus resistance    External rotation with good strength and minimal pain testing versus resistance (more uncomfortable with internal and external rotation)  good biceps strength and NT bicipital tendon anterior  Sensation intact deltoid and in UE diffusely to LT  Good UE strength including good grip strength  No radicular signs in UE Nontender palpating the cervical and thoracic spine nontender in the infrascapular region.  Nontender in the trap muscle complex Neuro/psychiatric - affect was not flat, appropriate with conversation  Alert and oriented  Results for orders placed or performed in visit on 11/26/19  CBC w/ Diff  Result Value Ref Range   WBC 5.0 3.8 - 10.8 Thousand/uL   RBC 5.21 4.20 - 5.80 Million/uL   Hemoglobin 14.4 13.2 - 17.1 g/dL   HCT 01/24/20 38 - 50 %   MCV 83.1 80.0 - 100.0 fL   MCH 27.6 27.0 - 33.0 pg   MCHC 33.3 32.0 - 36.0 g/dL   RDW 02.5 85.2 - 77.8 %   Platelets 304 140 - 400 Thousand/uL   MPV 9.7 7.5 - 12.5 fL   Neutro Abs 2,895 1,500 - 7,800 cells/uL   Lymphs Abs 1,515 850 - 3,900 cells/uL   Absolute  Monocytes 470 200 - 950 cells/uL   Eosinophils Absolute 70 15 - 500 cells/uL   Basophils Absolute 50 0 - 200 cells/uL   Neutrophils Relative % 57.9 %   Total Lymphocyte 30.3 %   Monocytes Relative 9.4 %   Eosinophils Relative 1.4 %   Basophils Relative 1.0 %  CMP w GFR  Result Value Ref Range   Glucose, Bld 92 65 - 99 mg/dL   BUN 12  7 - 25 mg/dL   Creat 1.60 7.37 - 1.06 mg/dL   GFR, Est Non African American 100 > OR = 60 mL/min/1.36m2   GFR, Est African American 116 > OR = 60 mL/min/1.48m2   BUN/Creatinine Ratio NOT APPLICABLE 6 - 22 (calc)   Sodium 140 135 - 146 mmol/L   Potassium 4.2 3.5 - 5.3 mmol/L   Chloride 104 98 - 110 mmol/L   CO2 29 20 - 32 mmol/L   Calcium 9.5 8.6 - 10.3 mg/dL   Total Protein 7.7 6.1 - 8.1 g/dL   Albumin 4.5 3.6 - 5.1 g/dL   Globulin 3.2 1.9 - 3.7 g/dL (calc)   AG Ratio 1.4 1.0 - 2.5 (calc)   Total Bilirubin 0.4 0.2 - 1.2 mg/dL   Alkaline phosphatase (APISO) 53 36 - 130 U/L   AST 20 10 - 40 U/L   ALT 27 9 - 46 U/L  Lipid Panel  Result Value Ref Range   Cholesterol 152 <200 mg/dL   HDL 48 > OR = 40 mg/dL   Triglycerides 269 (H) <150 mg/dL   LDL Cholesterol (Calc) 79 mg/dL (calc)   Total CHOL/HDL Ratio 3.2 <5.0 (calc)   Non-HDL Cholesterol (Calc) 104 <130 mg/dL (calc)  Vit D  Result Value Ref Range   Vit D, 25-Hydroxy 10 (L) 30 - 100 ng/mL       Assessment & Plan:   1. Left shoulder pain, unspecified chronicity 2. Rotator cuff strain, left, initial encounter - Ambulatory referral to Orthopedics  Concern with the length of time he has been battling this discomfort and its progression as described.  Do have concerns for a rotator cuff injury, with the supraspinatus involved.  Cannot exclude a SLAP type lesion.  Has not responded to conservative measures to date.  He has been limited from working out since April, and starting to have some concerns with his job, as he sometimes has to help with patients and lifting as part of that. Do feel  further evaluation with orthopedics is indicated, and do feel further imaging is likely needed that includes an MRI.  Referral to orthopedics placed today. In the interim, can apply ice topically to help if having more symptoms, also can use ibuprofen products with food in the short-term to help as needed, not routinely. Would stay active below shoulder level, although relative rest from any resistance type training and also trying to avoid overhead activities in the short-term as await orthopedics input.  He was very much in agreement with this approach.        Jamelle Haring, MD 06/09/20 10:35 AM

## 2020-06-12 ENCOUNTER — Encounter: Payer: Self-pay | Admitting: Internal Medicine

## 2020-06-17 ENCOUNTER — Other Ambulatory Visit: Payer: Self-pay

## 2020-06-17 DIAGNOSIS — M25512 Pain in left shoulder: Secondary | ICD-10-CM

## 2020-07-14 ENCOUNTER — Encounter: Payer: Self-pay | Admitting: Family Medicine

## 2021-05-13 ENCOUNTER — Ambulatory Visit: Payer: BC Managed Care – PPO | Admitting: Family Medicine

## 2021-05-13 ENCOUNTER — Ambulatory Visit
Admission: RE | Admit: 2021-05-13 | Discharge: 2021-05-13 | Disposition: A | Discharge status: Home / Self Care | Payer: BC Managed Care – PPO | Source: Ambulatory Visit | Attending: Family Medicine | Admitting: Family Medicine

## 2021-05-13 ENCOUNTER — Ambulatory Visit
Admission: RE | Admit: 2021-05-13 | Discharge: 2021-05-13 | Disposition: A | Discharge status: Home / Self Care | Payer: BC Managed Care – PPO | Attending: Family Medicine | Admitting: Family Medicine

## 2021-05-13 ENCOUNTER — Other Ambulatory Visit: Payer: Self-pay

## 2021-05-13 ENCOUNTER — Encounter: Payer: Self-pay | Admitting: Family Medicine

## 2021-05-13 VITALS — BP 118/86 | HR 76 | Temp 98.2°F | Ht 68.0 in | Wt 195.6 lb

## 2021-05-13 DIAGNOSIS — M542 Cervicalgia: Secondary | ICD-10-CM

## 2021-05-13 DIAGNOSIS — E559 Vitamin D deficiency, unspecified: Secondary | ICD-10-CM

## 2021-05-13 DIAGNOSIS — Z0000 Encounter for general adult medical examination without abnormal findings: Secondary | ICD-10-CM | POA: Diagnosis not present

## 2021-05-13 DIAGNOSIS — Z1159 Encounter for screening for other viral diseases: Secondary | ICD-10-CM

## 2021-05-13 DIAGNOSIS — Z114 Encounter for screening for human immunodeficiency virus [HIV]: Secondary | ICD-10-CM

## 2021-05-13 DIAGNOSIS — E663 Overweight: Secondary | ICD-10-CM

## 2021-05-13 DIAGNOSIS — J301 Allergic rhinitis due to pollen: Secondary | ICD-10-CM | POA: Diagnosis not present

## 2021-05-13 DIAGNOSIS — G8190 Hemiplegia, unspecified affecting unspecified side: Secondary | ICD-10-CM | POA: Insufficient documentation

## 2021-05-13 NOTE — Assessment & Plan Note (Signed)
This is a chronic issue that is currently stable, he relays a history of mild right upper extremity relatively rapid onset of paresthesias and subjective weakness.  Evaluation through neurology, who he still follows, inclusive of brain imaging without reported confirmation of etiology per patient.  He states that he has been asymptomatic for several months, no weakness, does have occasional tightness around the neck, physical exam is consistent with paraspinal cervical spasm without overt evidence of radiculopathy during today's visit.  I have advised further evaluation with dedicated cervical spine x-ray series.  Pending results, we can discuss neck steps as clinically guided.

## 2021-05-13 NOTE — Assessment & Plan Note (Signed)
Serum studies ordered.

## 2021-05-13 NOTE — Assessment & Plan Note (Signed)
He is currently asymptomatic, risk stratification labs obtained, dietary and exercise modification information provided.

## 2021-05-13 NOTE — Assessment & Plan Note (Signed)
Is a chronic issue that is currently stable, previous treatment with prescription of Flonase x1 year, he did not note significant benefit.  Currently he states that he is asymptomatic, has had history of snoring that he relays as positional, no current issues of the same, no sleep issues, no fevers, no chills.  He can follow-up on as-needed basis for this, OTC medications advised on a as needed basis.

## 2021-05-13 NOTE — Patient Instructions (Signed)
-   Obtain x-ray of your neck spine today - Obtain fasting labs - Diet and exercise recommendations below - Our office will contact you with results and discussion of next steps - Contact us for any questions between now and then  Diet & Exercise Recommendations Dietary changes to include reducing saturated fats, sodium, avoiding red meat, fried, processed foods, full-fat dairy, baked goods, and sweets. Incorporate more fruits, vegetables, fiber-rich foods such as whole grains, and transition to more eggs and lean meats. Make realistic changes where you can and stick to it!  Physical activity should be focused on getting 150 to 300 minutes per week of moderate to vigorous activity (30 minutes of activity at least 5 days of the week at a level of intensity where you can carry on a conversation without being out of breath). Any increase in activity is beneficial to your health! Physical activity reduces symptoms of depression and anxiety and improves sleep quality. Evidence for the benefits of physical activity for weight gain, adiposity, and bone health exists for children as young as three years and on up. Increasing physical activity in older adults can help them maintain independence by reducing cognitive decline and falls.

## 2021-05-13 NOTE — Progress Notes (Signed)
Annual Physical Exam Visit  Patient Information:  Patient ID: Casey Stevens, male DOB: 1986/02/01 Age: 35 y.o. MRN: [id number]   Subjective:   CC: Annual Physical Exam  HPI:  Casey Stevens is here for their annual physical.  I reviewed the past medical history, family history, social history, surgical history, and allergies today and changes were made as necessary.  Please see the problem list section below for additional details.  Past Medical History: Past Medical History:  Diagnosis Date   Allergy    Bacterial meningitis 1999   GERD (gastroesophageal reflux disease)    TB (pulmonary tuberculosis)    Vitamin D deficiency 05/15/2018   Past Surgical History: Past Surgical History:  Procedure Laterality Date   bacterial meningitis     CRANIOTOMY Right 1999   Social History: Social History   Socioeconomic History   Marital status: Married    Spouse name: Tysean Vandervliet   Number of children: 3   Years of education: 14   Highest education level: Associate degree: academic program  Occupational History   Not on file  Tobacco Use   Smoking status: Former    Pack years: 0.00   Smokeless tobacco: Never  Vaping Use   Vaping Use: Never used  Substance and Sexual Activity   Alcohol use: Yes    Comment: ocassional   Drug use: Never   Sexual activity: Yes    Partners: Female  Other Topics Concern   Not on file  Social History Narrative   Not on file   Social Determinants of Health   Financial Resource Strain: Not on file  Food Insecurity: Not on file  Transportation Needs: Not on file  Physical Activity: Not on file  Stress: Not on file  Social Connections: Not on file   Family History: Family History  Problem Relation Age of Onset   Hypertension Mother    Heart attack Maternal Grandmother    Asthma Maternal Grandmother    Heart failure Paternal Grandmother    Allergies: No Known Allergies Health Maintenance: Health Maintenance  Topic Date Due    Hepatitis C Screening  Never done   COVID-19 Vaccine (3 - Booster for Pfizer series) 05/09/2020   INFLUENZA VACCINE  06/14/2021   TETANUS/TDAP  01/25/2026   HIV Screening  Completed   Pneumococcal Vaccine 28-47 Years old  Aged Out   HPV VACCINES  Aged Out    HM Colonoscopy     This patient has no relevant Health Maintenance data.       Medications: Current Outpatient Medications on File Prior to Visit  Medication Sig Dispense Refill   ascorbic acid (VITAMIN C) 1000 MG tablet Take 1,000 mg by mouth daily.     Cholecalciferol 125 MCG (5000 UT) capsule Take 1,000 Units by mouth daily.     famotidine (PEPCID) 10 MG tablet Take 10 mg by mouth as needed.     Ginkgo Biloba 40 MG TABS Take 1 capsule by mouth as needed.     loratadine (CLARITIN) 10 MG tablet Take 10 mg by mouth daily.     Mag Oxide-Vit D3-Turmeric (MAGNESIUM-VITAMIN D3-TURMERIC) [phone] MG-UNIT-MG TABS Take 1 capsule by mouth as needed.     Multiple Vitamin (MULTIVITAMIN) tablet Take 1 tablet by mouth daily.     Omega-3 Fatty Acids (FISH OIL) 1000 MG CAPS Take 1 capsule (1,000 mg total) by mouth daily. (actually 1500 mg)     vitamin B-12 (CYANOCOBALAMIN) 1000 MCG tablet Take 1,000 mcg by  mouth daily.     No current facility-administered medications on file prior to visit.    Review of Systems: No headache, visual changes, nausea, vomiting, diarrhea, constipation, dizziness, abdominal pain, skin rash, fevers, chills, night sweats, swollen lymph nodes, weight loss, chest pain, body aches, joint swelling, muscle aches, shortness of breath, mood changes, visual or auditory hallucinations reported.  Objective:   Vitals:   05/13/21 1344  BP: 118/86  Pulse: 76  Temp: 98.2 F (36.8 C)  SpO2: 99%   Vitals:   05/13/21 1344  Weight: 195 lb 9.6 oz (88.7 kg)  Height: 5\' 8"  (1.727 m)   Body mass index is 29.74 kg/m.  General: Well Developed, well nourished, and in no acute distress.  Neuro: Alert and oriented x3,  extra-ocular muscles intact, sensation grossly intact. Cranial nerves II through XII are intact, motor, sensory, and coordinative functions are all intact. HEENT: Normocephalic, atraumatic, pupils equal round reactive to light, neck supple, no masses, no lymphadenopathy, thyroid nonpalpable. Oropharynx, nasopharynx, external ear canals are unremarkable. Skin: Warm and dry, no rashes noted.  Cardiac: Regular rate and rhythm, no murmurs rubs or gallops.  Respiratory: Clear to auscultation bilaterally. Not using accessory muscles, speaking in full sentences.  Abdominal: Soft, nontender, nondistended, positive bowel sounds, no masses, no organomegaly.  Musculoskeletal: Shoulder, elbow, wrist, hip, knee, ankle stable, and with full range of motion. Paraspinal symmetric tenderness, negative Spurling's bilaterally, symmetric upper extremity strength, reflexes intact  Impression and Recommendations:   The patient was counselled, risk factors were discussed, and anticipatory guidance given.  Seasonal allergic rhinitis due to pollen Is a chronic issue that is currently stable, previous treatment with prescription of Flonase x1 year, he did not note significant benefit.  Currently he states that he is asymptomatic, has had history of snoring that he relays as positional, no current issues of the same, no sleep issues, no fevers, no chills.  He can follow-up on as-needed basis for this, OTC medications advised on a as needed basis.  Overweight with body mass index (BMI) 25.0-29.9 He is currently asymptomatic, risk stratification labs obtained, dietary and exercise modification information provided.  Neck pain This is a chronic issue that is currently stable, he relays a history of mild right upper extremity relatively rapid onset of paresthesias and subjective weakness.  Evaluation through neurology, who he still follows, inclusive of brain imaging without reported confirmation of etiology per patient.  He  states that he has been asymptomatic for several months, no weakness, does have occasional tightness around the neck, physical exam is consistent with paraspinal cervical spasm without overt evidence of radiculopathy during today's visit.  I have advised further evaluation with dedicated cervical spine x-ray series.  Pending results, we can discuss neck steps as clinically guided.  Vitamin D deficiency Serum studies ordered.   Orders & Medications No orders of the defined types were placed in this encounter.  Orders Placed This Encounter  Procedures   DG Cervical Spine Complete   CBC   Comprehensive metabolic panel   Lipid panel   Hemoglobin A1c   TSH   VITAMIN D 25 Hydroxy (Vit-D Deficiency, Fractures)   Hepatitis C Antibody   HIV antibody (with reflex)      Return if symptoms worsen or fail to improve.    09-16-2001, MD   Primary Care Sports Medicine [facility]

## 2021-05-18 NOTE — Telephone Encounter (Signed)
Please advise if patient can do a MyChart virtual visit to follow-up on labs and X-Ray per his request.

## 2021-05-18 NOTE — Telephone Encounter (Signed)
MyChart video visit would be fine to follow-up x-ray, labs, and next steps.

## 2021-05-19 ENCOUNTER — Other Ambulatory Visit
Admission: RE | Admit: 2021-05-19 | Discharge: 2021-05-19 | Disposition: A | Discharge status: Home / Self Care | Payer: BC Managed Care – PPO | Attending: Family Medicine | Admitting: Family Medicine

## 2021-05-19 ENCOUNTER — Other Ambulatory Visit: Payer: Self-pay

## 2021-05-19 DIAGNOSIS — Z1159 Encounter for screening for other viral diseases: Secondary | ICD-10-CM | POA: Insufficient documentation

## 2021-05-19 DIAGNOSIS — E663 Overweight: Secondary | ICD-10-CM | POA: Insufficient documentation

## 2021-05-19 DIAGNOSIS — Z114 Encounter for screening for human immunodeficiency virus [HIV]: Secondary | ICD-10-CM | POA: Diagnosis not present

## 2021-05-19 DIAGNOSIS — E559 Vitamin D deficiency, unspecified: Secondary | ICD-10-CM | POA: Diagnosis not present

## 2021-05-19 DIAGNOSIS — Z0000 Encounter for general adult medical examination without abnormal findings: Secondary | ICD-10-CM | POA: Insufficient documentation

## 2021-05-19 LAB — CBC
HCT: 38.7 % — ABNORMAL LOW (ref 39.0–52.0)
Hemoglobin: 13 g/dL (ref 13.0–17.0)
MCH: 27.8 pg (ref 26.0–34.0)
MCHC: 33.6 g/dL (ref 30.0–36.0)
MCV: 82.7 fL (ref 80.0–100.0)
Platelets: 244 10*3/uL (ref 150–400)
RBC: 4.68 MIL/uL (ref 4.22–5.81)
RDW: 12.3 % (ref 11.5–15.5)
WBC: 4.2 10*3/uL (ref 4.0–10.5)
nRBC: 0 % (ref 0.0–0.2)

## 2021-05-19 LAB — LIPID PANEL
Cholesterol: 156 mg/dL (ref 0–200)
HDL: 55 mg/dL (ref >40)
LDL Cholesterol: 84 mg/dL (ref 0–99)
Total CHOL/HDL Ratio: 2.8 RATIO
Triglycerides: 87 mg/dL (ref <150)
VLDL: 17 mg/dL (ref 0–40)

## 2021-05-19 LAB — TSH: TSH: 1.314 u[IU]/mL (ref 0.350–4.500)

## 2021-05-19 LAB — COMPREHENSIVE METABOLIC PANEL
ALT: 19 U/L (ref 0–44)
AST: 19 U/L (ref 15–41)
Albumin: 4.1 g/dL (ref 3.5–5.0)
Alkaline Phosphatase: 46 U/L (ref 38–126)
Anion gap: 4 — ABNORMAL LOW (ref 5–15)
BUN: 12 mg/dL (ref 6–20)
CO2: 29 mmol/L (ref 22–32)
Calcium: 8.7 mg/dL — ABNORMAL LOW (ref 8.9–10.3)
Chloride: 104 mmol/L (ref 98–111)
Creatinine, Ser: 0.93 mg/dL (ref 0.61–1.24)
GFR, Estimated: >60 mL/min (ref >60)
Glucose, Bld: 97 mg/dL (ref 70–99)
Potassium: 3.8 mmol/L (ref 3.5–5.1)
Sodium: 137 mmol/L (ref 135–145)
Total Bilirubin: 0.7 mg/dL (ref 0.3–1.2)
Total Protein: 7.6 g/dL (ref 6.5–8.1)

## 2021-05-19 LAB — VITAMIN D 25 HYDROXY (VIT D DEFICIENCY, FRACTURES): Vit D, 25-Hydroxy: 22.32 ng/mL — ABNORMAL LOW (ref 30–100)

## 2021-05-19 LAB — HEPATITIS C ANTIBODY: HCV Ab: NONREACTIVE

## 2021-05-19 LAB — HIV ANTIBODY (ROUTINE TESTING W REFLEX): HIV Screen 4th Generation wRfx: NONREACTIVE

## 2021-05-19 NOTE — Telephone Encounter (Signed)
Please schedule patient for a follow-up MyChart visit.  Needs to have labs done prior to appointment.

## 2021-05-19 NOTE — Telephone Encounter (Signed)
Appointment scheduled.

## 2021-05-20 ENCOUNTER — Other Ambulatory Visit: Payer: Self-pay | Admitting: Family Medicine

## 2021-05-20 DIAGNOSIS — R7989 Other specified abnormal findings of blood chemistry: Secondary | ICD-10-CM

## 2021-05-20 LAB — HEMOGLOBIN A1C
Hgb A1c MFr Bld: 5.5 % (ref 4.8–5.6)
Mean Plasma Glucose: 111 mg/dL

## 2021-05-20 MED ORDER — VITAMIN D (ERGOCALCIFEROL) 1.25 MG (50000 UNIT) PO CAPS: 50000.0000 [IU] | ORAL_CAPSULE | ORAL | 0 refills | Status: DC

## 2021-05-24 ENCOUNTER — Telehealth: Payer: Self-pay

## 2021-05-24 ENCOUNTER — Telehealth: Payer: BC Managed Care – PPO | Admitting: Family Medicine

## 2021-05-24 ENCOUNTER — Encounter: Payer: Self-pay | Admitting: Family Medicine

## 2021-05-24 VITALS — Ht 68.0 in | Wt 191.0 lb

## 2021-05-24 DIAGNOSIS — M47812 Spondylosis without myelopathy or radiculopathy, cervical region: Secondary | ICD-10-CM | POA: Diagnosis not present

## 2021-05-24 NOTE — Progress Notes (Signed)
Primary Care / Sports Medicine Virtual Visit  Patient Information:  Patient ID: Casey Stevens, male DOB: [birth year] Age: 35 y.o. MRN: [id number]   Casey Stevens is a pleasant 35 y.o. male presenting with the following:  Chief Complaint  Patient presents with   Follow-up   Results    Discuss X-Rays and labs    Review of Systems: No fevers, chills, night sweats, weight loss, chest pain, or shortness of breath.   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   Cholecalciferol 125 MCG (5000 UT) capsule Take 1,000 Units by mouth daily.   famotidine (PEPCID) 10 MG tablet Take 10 mg by mouth as needed.   Ginkgo Biloba 40 MG TABS Take 1 capsule by mouth as needed.   loratadine (CLARITIN) 10 MG tablet Take 10 mg by mouth daily.   Mag Oxide-Vit D3-Turmeric (MAGNESIUM-VITAMIN D3-TURMERIC) [phone] MG-UNIT-MG TABS Take 1 capsule by mouth as needed.   Multiple Vitamin (MULTIVITAMIN) tablet Take 1 tablet by mouth daily.   Omega-3 Fatty Acids (FISH OIL) 1000 MG CAPS Take 1 capsule (1,000 mg total) by mouth daily. (actually 1500 mg)   vitamin B-12 (CYANOCOBALAMIN) 1000 MCG tablet Take 1,000 mcg by mouth daily.   Vitamin D, Ergocalciferol, (DRISDOL) 1.25 MG (50000 UNIT) CAPS capsule Take 1 capsule (50,000  Units total) by mouth every 7 (seven) days. Take for 8 total doses(weeks)   No facility-administered encounter medications on file as of 05/24/2021.   Past Surgical History:  Procedure Laterality Date   bacterial meningitis     CRANIOTOMY Right 1999    Virtual Visit via MyChart Video:   I connected with Casey Stevens on 05/24/21 via MyChart Video and verified that I am speaking with the correct person using appropriate identifiers.   The limitations, risks, security and privacy concerns of performing an evaluation and management service by MyChart Video, including the higher likelihood of inaccurate diagnoses and treatments, and the availability of in person appointments were reviewed. The possible need of an additional face-to-face encounter for complete and high quality delivery of care was discussed. The patient was also made aware that there may be a patient responsible charge related to this service. The patient expressed understanding and wishes to proceed.  Provider location is in medical facility. Patient location is at their work, different from provider location. People involved in care of the patient during this telehealth encounter were myself, my nurse/medical assistant, and my front office/scheduling team member.  Objective findings:   General: Speaking full sentences, no audible heavy breathing. Sounds alert and appropriately interactive. Well-appearing.  Independent interpretation of notes and tests performed by another provider:   Independent interpretation of cervical spine films dated 05/13/2021 reveals subtle rightward torsion of the vertebrae on AP view, maintained foraminae  bilaterally, subtle anterior endplate osteophyte formation at C4-5 and C5-6, no facet arthropathy noted, intervertebral space preserved, no acute osseous abnormalities noted  Pertinent History, Exam, Impression, and Recommendations:   Cervical spondylosis Patient with chronic right-sided  neck/shoulder pain with recent radiographs revealing subtle spondylosis at C4-5 and C5-6 with rightward torsion most consistent with asymmetric paraspinal spasm.  I have advised patient of the same as well as treatment strategy.  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  He is amenable to a formal physical therapy program that will oversee a primarily home-based regimen and status update in 6 weeks.  If suboptimal progress at the end of 6 weeks despite therapy, anticipate advanced imaging.  If adequately improved, transition to fully home-based maintenance program, follow-up as needed.   Orders & Medications No orders of the defined types were placed in this encounter.  Orders Placed This Encounter  Procedures   Ambulatory referral to Physical Therapy     I discussed the above assessment and treatment plan with the patient. The patient was provided an opportunity to ask questions and all were answered. The patient agreed with the plan and demonstrated an understanding of the instructions.   The patient was advised to call back or seek an in-person evaluation if the symptoms worsen or if the condition fails to improve as anticipated.   I provided a total time of 31 minutes including both face-to-face and non-face-to-face time on 05/24/2021 inclusive of time utilized for medical chart review, information gathering, care coordination with staff, and documentation completion.    Jerrol Banana, MD   Primary Care Sports Medicine [facility]

## 2021-05-24 NOTE — Patient Instructions (Signed)
-   Start physical therapy, referral coordinator will contact you, PT number below - PT will oversee a primarily home-based program, perform these exercises daily - Provide a status update in 6 weeks, contact us for any questions between now and then

## 2021-05-24 NOTE — Telephone Encounter (Signed)
I discussed the limitations, risks, security and privacy concerns of performing an evaluation and management service by WebEx/MyChart/Doximity Video, including the higher likelihood of inaccurate diagnosis and treatment, and the availability of in person appointments. We also discussed the likely need of an additional face to face encounter for complete and high quality delivery of care. I also discussed with the patient that there may be a patient responsible charge related to this service. The patient expressed understanding and wishes to proceed.  

## 2021-05-24 NOTE — Assessment & Plan Note (Signed)
Patient with chronic right-sided neck/shoulder pain with recent radiographs revealing subtle spondylosis at C4-5 and C5-6 with rightward torsion most consistent with asymmetric paraspinal spasm.  I have advised patient of the same as well as treatment strategy.  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  He is amenable to a formal physical therapy program that will oversee a primarily home-based regimen and status update in 6 weeks.  If suboptimal progress at the end of 6 weeks despite therapy, anticipate advanced imaging.  If adequately improved, transition to fully home-based maintenance program, follow-up as needed.

## 2022-05-20 ENCOUNTER — Encounter: Payer: Self-pay | Admitting: Family Medicine

## 2022-05-20 ENCOUNTER — Telehealth (INDEPENDENT_AMBULATORY_CARE_PROVIDER_SITE_OTHER): Payer: BC Managed Care – PPO | Admitting: Family Medicine

## 2022-05-20 DIAGNOSIS — S30861A Insect bite (nonvenomous) of abdominal wall, initial encounter: Secondary | ICD-10-CM | POA: Diagnosis not present

## 2022-05-20 DIAGNOSIS — W57XXXA Bitten or stung by nonvenomous insect and other nonvenomous arthropods, initial encounter: Secondary | ICD-10-CM

## 2022-05-20 MED ORDER — DOXYCYCLINE HYCLATE 100 MG PO TABS: ORAL_TABLET | ORAL | 0 refills | Status: DC

## 2022-05-20 NOTE — Progress Notes (Signed)
Primary Care / Sports Medicine Virtual Visit  Patient Information:  Patient ID: HAYZEN LORENSON, male DOB: 17-Oct-1986 Age: 36 y.o. MRN: [id number]   ADOLPH CLUTTER is a pleasant 37 y.o. male presenting with the following:  Chief Complaint  Patient presents with   Follow-up    Urgent Care follow for tick bite, was bit 05/10/2022, went to UC in [place] was tested and would like to go over those results    Review of Systems: No fevers, chills, night sweats, weight loss, chest pain, or shortness of breath.   Patient Active Problem List   Diagnosis Date Noted   Tick bite of abdominal wall 05/20/2022   Cervical spondylosis 05/24/2021   Hemiplegia affecting dominant side (HCC) 05/13/2021   Overweight with body mass index (BMI) 25.0-29.9 05/13/2021   Left shoulder pain 06/09/2020   Strain of left rotator cuff capsule 06/09/2020   Vitamin D deficiency 05/15/2018   Seasonal allergic rhinitis due to pollen 04/05/2018   Positive TB test 04/05/2018   Past Medical History:  Diagnosis Date   Allergy    Bacterial meningitis 1999   GERD (gastroesophageal reflux disease)    TB (pulmonary tuberculosis)    Vitamin D deficiency 05/15/2018   Outpatient Encounter Medications as of 05/20/2022  Medication Sig   ascorbic acid (VITAMIN C) 1000 MG tablet Take 1,000 mg by mouth daily.   Cholecalciferol 125 MCG (5000 UT) capsule Take 1,000 Units by mouth daily.   doxycycline (VIBRA-TABS) 100 MG tablet Take 2 tablets (200 mg) PO once then in 12 hours take 1 tablet (100 mg) PO, continue 100 mg PO every 12 hours x 7 days total   famotidine (PEPCID) 10 MG tablet Take 10 mg by mouth as needed.   Ginkgo Biloba 40 MG TABS Take 1 capsule by mouth as needed.   loratadine (CLARITIN) 10 MG tablet Take 10 mg by mouth daily.   Mag Oxide-Vit D3-Turmeric (MAGNESIUM-VITAMIN D3-TURMERIC) [id number] MG-UNIT-MG TABS Take 1 capsule by mouth as needed.   Multiple Vitamin (MULTIVITAMIN) tablet Take 1 tablet by  mouth daily.   Omega-3 Fatty Acids (FISH OIL) 1000 MG CAPS Take 1 capsule (1,000 mg total) by mouth daily. (actually 1500 mg)   vitamin B-12 (CYANOCOBALAMIN) 1000 MCG tablet Take 1,000 mcg by mouth daily.   [DISCONTINUED] Vitamin D, Ergocalciferol, (DRISDOL) 1.25 MG (50000 UNIT) CAPS capsule Take 1 capsule (50,000 Units total) by mouth every 7 (seven) days. Take for 8 total doses(weeks)   No facility-administered encounter medications on file as of 05/20/2022.   Past Surgical History:  Procedure Laterality Date   bacterial meningitis     CRANIOTOMY Right 1999    Virtual Visit via MyChart Video:   I connected with Jannette Spanner on 05/20/22 via MyChart Video and verified that I am speaking with the correct person using appropriate identifiers.   The limitations, risks, security and privacy concerns of performing an evaluation and management service by MyChart Video, including the higher likelihood of inaccurate diagnoses and treatments, and the availability of in person appointments were reviewed. The possible need of an additional face-to-face encounter for complete and high quality delivery of care was discussed. The patient was also made aware that there may be a patient responsible charge related to this service. The patient expressed understanding and wishes to proceed.  Provider location is in medical facility. Patient location is At their work, different from provider location. People involved in care of the patient during this telehealth encounter were  myself, my nurse/medical assistant, and my front office/scheduling team member.  Objective findings:   General: Speaking full sentences, no audible heavy breathing. Sounds alert and appropriately interactive. Well-appearing. Face symmetric. Extraocular movements intact. Pupils equal and round. No nasal flaring or accessory muscle use visualized.  Independent interpretation of notes and tests performed by another provider:    None  Pertinent History, Exam, Impression, and Recommendations:   Tick bite of abdominal wall [place], noted tick at the waistline of his abdomen on 05/10/2022, did note right ankle pain the next day, presented to urgent care at [facility] on 05/12/2022 where labs were drawn.  He states that symptoms have been stable without change, lab results did come back negative for Lyme, [facility] spotted fever and Ehrlichia results abnormal.  Given his clinical history, lab results, plan for doxycycline 7-day course, and we will recheck panel in 2 weeks.  Review of urgent care visit at [facility] health care from 05/12/2022, results from Lyme disease serology, RMSF antibody, and Ehrlichia antibody panel were reviewed  Orders & Medications Meds ordered this encounter  Medications   doxycycline (VIBRA-TABS) 100 MG tablet    Sig: Take 2 tablets (200 mg) PO once then in 12 hours take 1 tablet (100 mg) PO, continue 100 mg PO every 12 hours x 7 days total    Dispense:  15 tablet    Refill:  0   No orders of the defined types were placed in this encounter.    I discussed the above assessment and treatment plan with the patient. The patient was provided an opportunity to ask questions and all were answered. The patient agreed with the plan and demonstrated an understanding of the instructions.   The patient was advised to call back or seek an in-person evaluation if the symptoms worsen or if the condition fails to improve as anticipated.   I provided a total time of 30 minutes including both face-to-face and non-face-to-face time on 05/20/2022 inclusive of time utilized for medical chart review, information gathering, care coordination with staff, and documentation completion.    Jerrol Banana, MD   Primary Care Sports Medicine [facility]

## 2022-05-20 NOTE — Telephone Encounter (Signed)
Please advise 

## 2022-05-20 NOTE — Assessment & Plan Note (Addendum)
[place], noted tick at the waistline of his abdomen on 05/10/2022, did note right ankle pain the next day, presented to urgent care at [facility] on 05/12/2022 where labs were drawn.  He states that symptoms have been stable without change, lab results did come back negative for Lyme, [facility] spotted fever and Ehrlichia results abnormal.  Given his clinical history, lab results, plan for doxycycline 7-day course, and we will recheck panel in 2 weeks.  Review of urgent care visit at [facility] health care from 05/12/2022, results from Lyme disease serology, RMSF antibody, and Ehrlichia antibody panel were reviewed

## 2022-06-09 ENCOUNTER — Ambulatory Visit: Payer: BC Managed Care – PPO | Admitting: Family Medicine

## 2022-06-15 ENCOUNTER — Ambulatory Visit (INDEPENDENT_AMBULATORY_CARE_PROVIDER_SITE_OTHER): Payer: BC Managed Care – PPO | Admitting: Family Medicine

## 2022-06-15 ENCOUNTER — Other Ambulatory Visit
Admission: RE | Admit: 2022-06-15 | Discharge: 2022-06-15 | Disposition: A | Discharge status: Home / Self Care | Payer: BC Managed Care – PPO | Attending: Family Medicine | Admitting: Family Medicine

## 2022-06-15 ENCOUNTER — Encounter: Payer: Self-pay | Admitting: Family Medicine

## 2022-06-15 VITALS — BP 104/82 | HR 76 | Ht 68.0 in | Wt 193.0 lb

## 2022-06-15 DIAGNOSIS — L731 Pseudofolliculitis barbae: Secondary | ICD-10-CM | POA: Diagnosis not present

## 2022-06-15 DIAGNOSIS — W57XXXD Bitten or stung by nonvenomous insect and other nonvenomous arthropods, subsequent encounter: Secondary | ICD-10-CM | POA: Insufficient documentation

## 2022-06-15 DIAGNOSIS — S30861D Insect bite (nonvenomous) of abdominal wall, subsequent encounter: Secondary | ICD-10-CM | POA: Diagnosis not present

## 2022-06-15 DIAGNOSIS — R7989 Other specified abnormal findings of blood chemistry: Secondary | ICD-10-CM | POA: Diagnosis not present

## 2022-06-15 DIAGNOSIS — Z0000 Encounter for general adult medical examination without abnormal findings: Secondary | ICD-10-CM | POA: Diagnosis present

## 2022-06-15 DIAGNOSIS — B353 Tinea pedis: Secondary | ICD-10-CM

## 2022-06-15 DIAGNOSIS — Z13220 Encounter for screening for lipoid disorders: Secondary | ICD-10-CM

## 2022-06-15 DIAGNOSIS — B351 Tinea unguium: Secondary | ICD-10-CM

## 2022-06-15 LAB — COMPREHENSIVE METABOLIC PANEL
ALT: 24 U/L (ref 0–44)
AST: 21 U/L (ref 15–41)
Albumin: 4.1 g/dL (ref 3.5–5.0)
Alkaline Phosphatase: 45 U/L (ref 38–126)
Anion gap: 4 — ABNORMAL LOW (ref 5–15)
BUN: 10 mg/dL (ref 6–20)
CO2: 29 mmol/L (ref 22–32)
Calcium: 9.1 mg/dL (ref 8.9–10.3)
Chloride: 106 mmol/L (ref 98–111)
Creatinine, Ser: 0.95 mg/dL (ref 0.61–1.24)
GFR, Estimated: >60 mL/min (ref >60)
Glucose, Bld: 87 mg/dL (ref 70–99)
Potassium: 3.7 mmol/L (ref 3.5–5.1)
Sodium: 139 mmol/L (ref 135–145)
Total Bilirubin: 0.5 mg/dL (ref 0.3–1.2)
Total Protein: 7.5 g/dL (ref 6.5–8.1)

## 2022-06-15 LAB — CBC
HCT: 42.3 % (ref 39.0–52.0)
Hemoglobin: 14.2 g/dL (ref 13.0–17.0)
MCH: 27.9 pg (ref 26.0–34.0)
MCHC: 33.6 g/dL (ref 30.0–36.0)
MCV: 83.1 fL (ref 80.0–100.0)
Platelets: 259 10*3/uL (ref 150–400)
RBC: 5.09 MIL/uL (ref 4.22–5.81)
RDW: 12.1 % (ref 11.5–15.5)
WBC: 3.7 10*3/uL — ABNORMAL LOW (ref 4.0–10.5)
nRBC: 0 % (ref 0.0–0.2)

## 2022-06-15 LAB — LIPID PANEL
Cholesterol: 153 mg/dL (ref 0–200)
HDL: 49 mg/dL (ref >40)
LDL Cholesterol: 82 mg/dL (ref 0–99)
Total CHOL/HDL Ratio: 3.1 RATIO
Triglycerides: 111 mg/dL (ref <150)
VLDL: 22 mg/dL (ref 0–40)

## 2022-06-15 LAB — VITAMIN D 25 HYDROXY (VIT D DEFICIENCY, FRACTURES): Vit D, 25-Hydroxy: 18.91 ng/mL — ABNORMAL LOW (ref 30–100)

## 2022-06-15 LAB — TSH: TSH: 0.766 u[IU]/mL (ref 0.350–4.500)

## 2022-06-15 NOTE — Assessment & Plan Note (Signed)
Symptoms have resolved, will order follow-up labs.

## 2022-06-15 NOTE — Assessment & Plan Note (Signed)
Significantly affected left great toe, secondarily to the left second toe, in setting of bilateral tinea pedis. Is amenable to referral to podiatry for further evaluation and management.

## 2022-06-15 NOTE — Assessment & Plan Note (Signed)
Chronic issue, stable, discussed medication and non-medication management - will try modifications at home first.

## 2022-06-15 NOTE — Assessment & Plan Note (Signed)
Annual examination completed, risk stratification labs ordered, anticipatory guidance provided.  We will follow labs once resulted. 

## 2022-06-15 NOTE — Assessment & Plan Note (Signed)
See additional assessment(s) for plan details. 

## 2022-06-15 NOTE — Progress Notes (Signed)
Annual Physical Exam Visit  Patient Information:  Patient ID: Casey Stevens, male DOB: February 20, 1986 Age: 36 y.o. MRN: [id number]   Subjective:   CC: Annual Physical Exam  HPI:  Casey Stevens is here for their annual physical.  I reviewed the past medical history, family history, social history, surgical history, and allergies today and changes were made as necessary.  Please see the problem list section below for additional details.  Past Medical History: Past Medical History:  Diagnosis Date   Allergy    environental   Bacterial meningitis 1999   GERD (gastroesophageal reflux disease)    managed with lifestyle   TB (pulmonary tuberculosis) 2018   possible patient exposure   Vitamin D deficiency 05/15/2018   Past Surgical History: Past Surgical History:  Procedure Laterality Date   bacterial meningitis     requiring craniotomy   CRANIOTOMY Right 1999   CRANIOTOMY Right 2000   no residual drain / shunt hardware   Family History: Family History  Problem Relation Age of Onset   Hypertension Mother    Heart attack Maternal Grandmother    Asthma Maternal Grandmother    Heart failure Paternal Grandmother    Allergies: No Known Allergies Health Maintenance: Health Maintenance  Topic Date Due   INFLUENZA VACCINE  06/14/2022   COVID-19 Vaccine (4 - Pfizer series) 07/01/2022 (Originally 09/09/2020)   TETANUS/TDAP  01/25/2026   Hepatitis C Screening  Completed   HIV Screening  Completed   HPV VACCINES  Aged Out    HM Colonoscopy     This patient has no relevant Health Maintenance data.      Medications: Current Outpatient Medications on File Prior to Visit  Medication Sig Dispense Refill   ascorbic acid (VITAMIN C) 1000 MG tablet Take 1,000 mg by mouth daily.     Cholecalciferol 125 MCG (5000 UT) capsule Take 1,000 Units by mouth daily.     famotidine (PEPCID) 10 MG tablet Take 10 mg by mouth as needed.     loratadine (CLARITIN) 10 MG tablet Take 10  mg by mouth as needed.     Mag Oxide-Vit D3-Turmeric (MAGNESIUM-VITAMIN D3-TURMERIC) [phone] MG-UNIT-MG TABS Take 1 capsule by mouth as needed.     Multiple Vitamin (MULTIVITAMIN) tablet Take 1 tablet by mouth daily.     vitamin B-12 (CYANOCOBALAMIN) 1000 MCG tablet Take 1,000 mcg by mouth daily.     No current facility-administered medications on file prior to visit.    Review of Systems: No headache, visual changes, nausea, vomiting, diarrhea, constipation, dizziness, abdominal pain, skin rash, fevers, chills, night sweats, swollen lymph nodes, weight loss, chest pain, body aches, joint swelling, muscle aches, shortness of breath, mood changes, visual or auditory hallucinations reported.  Objective:   Vitals:   06/15/22 0845  BP: 104/82  Pulse: 76  SpO2: 98%   Vitals:   06/15/22 0845  Weight: 193 lb (87.5 kg)  Height: 5\' 8"  (1.727 m)   Body mass index is 29.35 kg/m.  General: Well Developed, well nourished, and in no acute distress.  Neuro: Alert and oriented x3, extra-ocular muscles intact, sensation grossly intact. Cranial nerves II through XII are grossly intact, motor, sensory, and coordinative functions are intact. HEENT: Normocephalic, atraumatic, pupils equal round reactive to light, neck supple, no masses, no lymphadenopathy, thyroid nonpalpable. Oropharynx, nasopharynx, external ear canals are unremarkable. Skin: Warm and dry, no rashes noted. Left great toe with findings of toenail partial removal/breakdown, secondary to the left second toe, onychomycosis,  plantar aspects of bilateral feet and interdigital spaces with evidence of tinea pedis Cardiac: Regular rate and rhythm, no murmurs rubs or gallops. No peripheral edema. Pulses symmetric. Respiratory: Clear to auscultation bilaterally. Not using accessory muscles, speaking in full sentences.  Abdominal: Soft, nontender, nondistended, positive bowel sounds, no masses, no organomegaly. Musculoskeletal: Shoulder,  elbow, wrist, hip, knee, ankle stable, and with full range of motion.   Impression and Recommendations:   The patient was counselled, risk factors were discussed, and anticipatory guidance given.  Problem List Items Addressed This Visit       Musculoskeletal and Integument   Tick bite of abdominal wall    Symptoms have resolved, will order follow-up labs.      Relevant Orders   Lyme Disease Serology w/Reflex   Spotted Fever Group Antibodies   Ehrlichia antibody panel   Pseudofolliculitis barbae    Chronic issue, stable, discussed medication and non-medication management - will try modifications at home first.      Onychomycosis of left great toe    Significantly affected left great toe, secondarily to the left second toe, in setting of bilateral tinea pedis. Is amenable to referral to podiatry for further evaluation and management.      Tinea pedis of both feet    See additional assessment(s) for plan details.        Other   Annual physical exam - Primary    Annual examination completed, risk stratification labs ordered, anticipatory guidance provided.  We will follow labs once resulted.      Relevant Orders   Apo A1 + B + Ratio   CBC   Comprehensive metabolic panel   Lipid panel   TSH   VITAMIN D 25 Hydroxy (Vit-D Deficiency, Fractures)   Lyme Disease Serology w/Reflex   Spotted Fever Group Antibodies   Ehrlichia antibody panel   Other Visit Diagnoses     Low serum vitamin D       Relevant Orders   VITAMIN D 25 Hydroxy (Vit-D Deficiency, Fractures)   Screening for lipoid disorders       Relevant Orders   Apo A1 + B + Ratio   Lipid panel        Orders & Medications Medications: No orders of the defined types were placed in this encounter.  Orders Placed This Encounter  Procedures   Apo A1 + B + Ratio   CBC   Comprehensive metabolic panel   Lipid panel   TSH   VITAMIN D 25 Hydroxy (Vit-D Deficiency, Fractures)   Lyme Disease Serology w/Reflex    Spotted Fever Group Antibodies   Ehrlichia antibody panel     Return in about 1 year (around 06/16/2023) for Physical.    Casey Banana, MD   Primary Care Sports Medicine [facility]

## 2022-06-15 NOTE — Patient Instructions (Addendum)
-   Obtain fasting labs with orders provided (can have water or black coffee but otherwise no food or drink x 8 hours before labs) - Review information provided - Attend eye doctor annually, dentist every 6 months, work towards or maintain 30 minutes of moderate intensity physical activity at least 5 days per week, and consume a balanced diet - Return in 1 year for physical - Contact us for any questions between now and then  Pseudofolliculitis Barbae non-medication treatments If cessation of close hair removal is feasible - The most effective therapy for pseudofolliculitis barbae is the permanent cessation of close hair removal and allowing the hair to remain at a length that reduces the risk of recurrence (at least 0.5 cm initially). Discontinuation of shaving often leads to improvement in pseudofolliculitis barbae within a few months. (See 'Patients for whom hair regrowth is not acceptable' above.)  If cessation of close hair removal is not feasible - A multifaceted approach is taken for patients who cannot permanently cease close hair removal, including:  -Short-term cessation of close hair removal - When feasible, patients should discontinue close hair removal until the condition improves (usually one to six months). Subsequently, adjustments to hair removal techniques that may reduce the risk for recurrent pseudofolliculitis can be implemented. (See 'Short-term cessation of hair removal' above.)  -Adjustments to hair removal techniques - Preshave and postshave interventions, such as the routine incorporation of moisture and freeing of embedded hairs, may be beneficial for patients who must continue shaving. Transitioning from shaving to electric clippers or chemical depilatories may lead to improvement in some patients. (See 'Adjusting hair removal methods' above.)

## 2022-06-16 ENCOUNTER — Other Ambulatory Visit: Payer: Self-pay | Admitting: Family Medicine

## 2022-06-16 LAB — LYME DISEASE SEROLOGY W/REFLEX: Lyme Total Antibody EIA: NEGATIVE

## 2022-06-16 MED ORDER — VITAMIN D (ERGOCALCIFEROL) 1.25 MG (50000 UNIT) PO CAPS: 50000.0000 [IU] | ORAL_CAPSULE | ORAL | 0 refills | Status: AC

## 2022-06-17 LAB — MISC LABCORP TEST (SEND OUT): Labcorp test code: 216010

## 2022-06-17 LAB — ROCKY MTN SPOTTED FVR ABS PNL(IGG+IGM)
RMSF IgG: POSITIVE — AB
RMSF IgM: 0.7 index (ref 0.00–0.89)

## 2022-06-17 LAB — RMSF, IGG, IFA: RMSF, IGG, IFA: <1:64 {titer}

## 2022-06-19 LAB — EHRLICHIA ANTIBODY PANEL
E chaffeensis (HGE) Ab, IgG: NEGATIVE
E chaffeensis (HGE) Ab, IgM: NEGATIVE
E. Chaffeensis (HME) IgM Titer: NEGATIVE
E.Chaffeensis (HME) IgG: NEGATIVE

## 2023-06-19 ENCOUNTER — Encounter: Payer: BC Managed Care – PPO | Admitting: Family Medicine

## 2023-07-24 ENCOUNTER — Encounter: Payer: BC Managed Care – PPO | Admitting: Family Medicine
